# Patient Record
Sex: Male | Born: 1970 | Race: White | Marital: Married | Smoking: Never smoker
Health system: Southern US, Community
[De-identification: ages and names within clinical notes are randomized; demographics above are authoritative.]

## PROBLEM LIST (undated history)

## (undated) DIAGNOSIS — M436 Torticollis: Secondary | ICD-10-CM

## (undated) DIAGNOSIS — M5030 Other cervical disc degeneration, unspecified cervical region: Secondary | ICD-10-CM

## (undated) HISTORY — DX: Torticollis: M43.6

## (undated) HISTORY — DX: Other cervical disc degeneration, unspecified cervical region: M50.30

---

## 2011-10-10 ENCOUNTER — Encounter: Payer: Self-pay | Admitting: Emergency Medicine

## 2011-10-10 ENCOUNTER — Emergency Department (HOSPITAL_COMMUNITY)
Admission: EM | Admit: 2011-10-10 | Discharge: 2011-10-11 | Disposition: A | Discharge status: Home / Self Care | Payer: BC Managed Care – PPO | Attending: Emergency Medicine | Admitting: Emergency Medicine

## 2011-10-10 DIAGNOSIS — R630 Anorexia: Secondary | ICD-10-CM | POA: Insufficient documentation

## 2011-10-10 DIAGNOSIS — R1013 Epigastric pain: Secondary | ICD-10-CM | POA: Insufficient documentation

## 2011-10-10 DIAGNOSIS — R110 Nausea: Secondary | ICD-10-CM | POA: Insufficient documentation

## 2011-10-10 LAB — URINALYSIS, ROUTINE W REFLEX MICROSCOPIC
Hgb urine dipstick: NEGATIVE
Leukocytes, UA: NEGATIVE
Nitrite: NEGATIVE
Specific Gravity, Urine: 1.007 (ref 1.005–1.030)
Urobilinogen, UA: 0.2 mg/dL (ref 0.0–1.0)

## 2011-10-10 LAB — COMPREHENSIVE METABOLIC PANEL
AST: 21 U/L (ref 0–37)
Albumin: 4.1 g/dL (ref 3.5–5.2)
GFR calc Af Amer: >90 mL/min (ref >90)
GFR calc non Af Amer: >90 mL/min (ref >90)
Potassium: 3.9 mEq/L (ref 3.5–5.1)
Sodium: 139 mEq/L (ref 135–145)
Total Bilirubin: 0.8 mg/dL (ref 0.3–1.2)
Total Protein: 7.4 g/dL (ref 6.0–8.3)

## 2011-10-10 LAB — DIFFERENTIAL
Basophils Absolute: 0 10*3/uL (ref 0.0–0.1)
Lymphocytes Relative: 28 % (ref 12–46)
Monocytes Absolute: 1 10*3/uL (ref 0.1–1.0)
Neutro Abs: 5.7 10*3/uL (ref 1.7–7.7)

## 2011-10-10 LAB — CBC
HCT: 43.8 % (ref 39.0–52.0)
Hemoglobin: 14.9 g/dL (ref 13.0–17.0)
RDW: 12.2 % (ref 11.5–15.5)
WBC: 9.7 10*3/uL (ref 4.0–10.5)

## 2011-10-10 NOTE — ED Provider Notes (Signed)
Complains of epigastric pain for 4 days admits to diminished appetite pain is not improved or made worse by anything, waxes and wanes nonradiating no fever admits to diminished appetite. Treated with Cipro without relief On exam no distress abdomen tender epigastrium no guarding rigidity or rebound nondistended normal bowel sounds. No signs of peritonitis.   Doug Sou, MD 10/10/11 [phone]

## 2011-10-10 NOTE — ED Notes (Signed)
Received pt. From triage, NAD noted,pt. Alert and oriented

## 2011-10-10 NOTE — ED Notes (Signed)
PT. REPORTS MID ABDOMINAL PAIN FOR 2 DAYS , SEEN AT AN URGENT CARE - PRESCRIBED WITH CIPRO ANTIBIOTIC , SLIGHT NAUSEA , DENIES VOMITTING OR DIARRHEA ,  NO FEVER OR  CHILLS .

## 2011-10-11 ENCOUNTER — Telehealth: Payer: Self-pay | Admitting: Internal Medicine

## 2011-10-11 ENCOUNTER — Emergency Department (HOSPITAL_COMMUNITY): Payer: BC Managed Care – PPO

## 2011-10-11 MED ORDER — HYDROCODONE-ACETAMINOPHEN 5-325 MG PO TABS: 1.0000 | ORAL_TABLET | ORAL | Status: AC | PRN

## 2011-10-11 MED ORDER — HYDROCODONE-ACETAMINOPHEN 5-500 MG PO TABS: 1.0000 | ORAL_TABLET | Freq: Four times a day (QID) | ORAL | Status: DC | PRN

## 2011-10-11 NOTE — ED Notes (Signed)
Complaining of abd pain for 2 1/2 days. States initially sharp pains through out abd. Now intermittent pain. Transferred to CDU waiting for ultrasound

## 2011-10-11 NOTE — Telephone Encounter (Signed)
I have scheduled the patient an appt for 10/17/11 10:45 with Dr Arlyce Dice.  I have mailed him new patient information.  I have left him a message with all the information.  I have asked him to call back with any questions.

## 2011-10-17 ENCOUNTER — Ambulatory Visit (INDEPENDENT_AMBULATORY_CARE_PROVIDER_SITE_OTHER): Payer: BC Managed Care – PPO | Admitting: Gastroenterology

## 2011-10-17 ENCOUNTER — Encounter: Payer: Self-pay | Admitting: Gastroenterology

## 2011-10-17 VITALS — BP 108/70 | HR 72 | Wt 184.0 lb

## 2011-10-17 DIAGNOSIS — R1013 Epigastric pain: Secondary | ICD-10-CM

## 2011-10-17 MED ORDER — RABEPRAZOLE SODIUM 20 MG PO TBEC: 20.0000 mg | DELAYED_RELEASE_TABLET | Freq: Every day | ORAL | Status: DC

## 2011-10-17 NOTE — Assessment & Plan Note (Addendum)
Pain is suggestive of gallbladder pain despite the absence of stones on ultrasound. Ulcer and  nonulcer dyspepsia are other considerations.  Recommendations #1 begin AcipHex 20 mg daily #2 upper endoscopy #3 to consider HIDA scan if endoscopy is not diagnostic

## 2011-10-17 NOTE — Progress Notes (Signed)
History of Present Illness: [name] is a pleasant 41 year old white male referred by the ER for evaluation of abdominal pain. Approximately a week ago he developed moderately severe, sharp upper epigastric pain with radiation through and around to the back. Pain was worsened postprandially. He eventually was seen in the ER where laboratory including LFTs, CBC and ultrasound were normal. Pain has slowly subsided although it remains . He denies nausea, vomiting or fever. He has no prior GI problems. He is on no gastric irritants including nonsteroidals.    Past Medical History  Diagnosis Date  . Hyperlipidemia   . Hypertension    History reviewed. No pertinent past surgical history. family history includes Ovarian cancer in his mother.  There is no history of Colon cancer. Current Outpatient Prescriptions  Medication Sig Dispense Refill  . HYDROcodone-acetaminophen (NORCO) 5-325 MG per tablet Take 1 tablet by mouth every 4 (four) hours as needed for pain. 1 to 2 tabs PO Q4-6H PRN pain   15 tablet  0   Allergies as of 10/17/2011  . (No Known Allergies)    reports that he has never smoked. He does not have any smokeless tobacco history on file. He reports that he drinks alcohol. He reports that he does not use illicit drugs.     Review of Systems: Pertinent positive and negative review of systems were noted in the above HPI section. All other review of systems were otherwise negative.  Vital signs were reviewed in today's medical record Physical Exam: General: Well developed , well nourished, no acute distress Head: Normocephalic and atraumatic Eyes:  sclerae anicteric, EOMI Ears: Normal auditory acuity Mouth: No deformity or lesions Neck: Supple, no masses or thyromegaly Lungs: Clear throughout to auscultation Heart: Regular rate and rhythm; no murmurs, rubs or bruits Abdomen: Soft, non tender and non distended. No masses, hepatosplenomegaly or hernias noted. Normal Bowel  sounds Rectal:deferred Musculoskeletal: Symmetrical with no gross deformities  Skin: No lesions on visible extremities Pulses:  Normal pulses noted Extremities: No clubbing, cyanosis, edema or deformities noted Neurological: Alert oriented x 4, grossly nonfocal Cervical Nodes:  No significant cervical adenopathy Inguinal Nodes: No significant inguinal adenopathy Psychological:  Alert and cooperative. Normal mood and affect

## 2011-10-17 NOTE — Patient Instructions (Signed)
Your procedure has been scheduled for 10/23/2011, please follow the seperate instructions.  Take Aciphex once a day.

## 2011-10-23 ENCOUNTER — Encounter: Payer: Self-pay | Admitting: Gastroenterology

## 2011-10-23 ENCOUNTER — Ambulatory Visit (AMBULATORY_SURGERY_CENTER): Payer: BC Managed Care – PPO | Admitting: Gastroenterology

## 2011-10-23 VITALS — BP 115/77 | HR 75 | Temp 97.6°F | Resp 22 | Ht 70.0 in | Wt 184.0 lb

## 2011-10-23 DIAGNOSIS — R1013 Epigastric pain: Secondary | ICD-10-CM

## 2011-10-23 MED ORDER — RABEPRAZOLE SODIUM 20 MG PO TBEC: 20.0000 mg | DELAYED_RELEASE_TABLET | Freq: Every day | ORAL | Status: DC

## 2011-10-23 MED ORDER — SODIUM CHLORIDE 0.9 % IV SOLN: 500.0000 mL | INTRAVENOUS | Status: DC

## 2011-10-23 NOTE — Op Note (Signed)
[facility] [address] [place], [place]  [address]  ENDOSCOPY PROCEDURE REPORT  PATIENT:  Keith, Faulkner  MR#:  [id number] BIRTHDATE:  [birth year], 40 yrs. old  GENDER:  male  ENDOSCOPIST:  Barbette Hair. Arlyce Dice, MD Referred by:  PROCEDURE DATE:  10/23/2011 PROCEDURE:  EGD, diagnostic 43235 ASA CLASS:  Class I INDICATIONS:  abdominal pain  MEDICATIONS:   MAC sedation, administered by CRNA, glycopyrrolate (Robinal) 0.2 mg IV, 0.6cc simethancone 0.6 cc PO TOPICAL ANESTHETIC:  DESCRIPTION OF PROCEDURE:   After the risks and benefits of the procedure were explained, informed consent was obtained.  The [facility] [id number] endoscope was introduced through the mouth and advanced to the third portion of the duodenum.  The instrument was slowly withdrawn as the mucosa was fully examined. <<PROCEDUREIMAGES>>  The upper, middle, and distal third of the esophagus were carefully inspected and no abnormalities were noted. The z-line was well seen at the GEJ. The endoscope was pushed into the fundus which was normal including a retroflexed view. The antrum,gastric body, first and second part of the duodenum were unremarkable (see image1, image2, image3, image5, and image7).    Retroflexed views revealed no abnormalities.    The scope was then withdrawn from the patient and the procedure completed.  COMPLICATIONS:  None  ENDOSCOPIC IMPRESSION: 1) Normal EGD RECOMMENDATIONS: 1) continue PPI 2) call office for any more episodes of pain  ______________________________ Barbette Hair. Arlyce Dice, MD  CC:  n. eSIGNED:   Barbette Hair. Cherell Colvin at 10/23/2011 09:55 AM  Keith Faulkner, [phone]

## 2011-10-23 NOTE — Progress Notes (Signed)
The pt tolerated the egd very well. Maw  Propofol was administered by Iline Oven, CRNA. maw  Dr. Arlyce Dice had printed pt's Aciphex RX- didn't have a pharmacy on file.  Pt states he would prefer to have RX sent electronically to [facility] on [address].  Printed RX shredded.

## 2011-10-23 NOTE — Progress Notes (Signed)
Patient did not experience any of the following events: a burn prior to discharge; a fall within the facility; wrong site/side/patient/procedure/implant event; or a hospital transfer or hospital admission upon discharge from the facility. (G8907) Patient did not have preoperative order for IV antibiotic SSI prophylaxis. (G8918)  

## 2011-10-23 NOTE — Patient Instructions (Signed)
FOLLOW DISCHARGE INSTRUCTIONS (BLUE AND GREEN SHEETS).. CONTINUE PPIs. CALL OFFICE FOR MORE EPISODES OF PAIN.

## 2011-10-24 ENCOUNTER — Telehealth: Payer: Self-pay | Admitting: *Deleted

## 2011-10-24 NOTE — Telephone Encounter (Signed)

## 2011-10-30 ENCOUNTER — Telehealth: Payer: Self-pay | Admitting: Gastroenterology

## 2011-10-30 MED ORDER — RABEPRAZOLE SODIUM 20 MG PO TBEC: 20.0000 mg | DELAYED_RELEASE_TABLET | Freq: Every day | ORAL | Status: DC

## 2011-10-30 NOTE — Telephone Encounter (Signed)
Sent in rx to pharmacy.

## 2011-12-17 ENCOUNTER — Telehealth: Payer: Self-pay | Admitting: *Deleted

## 2011-12-17 NOTE — Telephone Encounter (Signed)
Pt returned call, stated he wanted to stay on Aciphex and for me to disregard paper from the insurance company wanting him to switch. Mailed him a discount savings card today for his Aciphex rx

## 2011-12-17 NOTE — Telephone Encounter (Signed)
Left message for pt that his [facility] wants [place] to change his PPI from Aciphex to Omeprazole...Marland KitchenL/M for pt to return call

## 2011-12-18 NOTE — ED Provider Notes (Signed)
History     CSN: [id number]  Arrival date & time 10/10/11  1933   First MD Initiated Contact with Patient 10/10/11 2253      Chief Complaint  Patient presents with  . Abdominal Pain   HPI: Patient is a 41 y.o. male presenting with abdominal pain. The history is provided by the patient.  Abdominal Pain The primary symptoms of the illness include abdominal pain and nausea. The primary symptoms of the illness do not include fever, vomiting, diarrhea, hematemesis or hematochezia. The current episode started more than 2 days ago. The onset of the illness was gradual. The problem has been gradually worsening.  The patient has not had a change in bowel habit. Additional symptoms associated with the illness include anorexia. Symptoms associated with the illness do not include chills or heartburn.  Pt reports 4 day hx of upper abd pain that has persisted. Pain intensity varies and is associated w/ nothing. Admits to mild intermittent nausea w/o vomiting and some anorexia but denies CP, SOB, fever or other sx's. [facility] and placed on Cipro which he states is not helping.  Past Medical History  Diagnosis Date  . Hyperlipidemia   . Hypertension     History reviewed. No pertinent past surgical history.  Family History  Problem Relation Age of Onset  . Ovarian cancer Mother   . Colon cancer Neg Hx     History  Substance Use Topics  . Smoking status: Never Smoker   . Smokeless tobacco: Never Used  . Alcohol Use: 1.8 oz/week    3 Glasses of wine per week     OCCASIONAL      Review of Systems  Constitutional: Negative.  Negative for fever and chills.  HENT: Negative.   Eyes: Negative.   Respiratory: Negative.   Cardiovascular: Negative.   Gastrointestinal: Positive for nausea, abdominal pain and anorexia. Negative for heartburn, vomiting, diarrhea, hematochezia and hematemesis.  Genitourinary: Negative.   Musculoskeletal: Negative.   Skin:  Negative.   Neurological: Negative.   Hematological: Negative.   Psychiatric/Behavioral: Negative.     Allergies  Review of patient's allergies indicates no known allergies.  Home Medications   Current Outpatient Rx  Name Route Sig Dispense Refill  . HYDROCODONE-ACETAMINOPHEN 5-325 MG PO TABS Oral Take 5-325 mg by mouth as needed.    Marland Kitchen RABEPRAZOLE SODIUM 20 MG PO TBEC Oral Take 1 tablet (20 mg total) by mouth daily. 15 tablet 0  . RABEPRAZOLE SODIUM 20 MG PO TBEC Oral Take 1 tablet (20 mg total) by mouth daily. 30 tablet 1    BP 122/84  Pulse 80  Temp(Src) 98.5 F (36.9 C) (Oral)  Resp 20  SpO2 96%  Physical Exam  Constitutional: He is oriented to person, place, and time. He appears well-developed and well-nourished.  HENT:  Head: Normocephalic and atraumatic.  Eyes: Conjunctivae are normal.  Neck: Neck supple.  Cardiovascular: Normal rate and regular rhythm.   Pulmonary/Chest: Effort normal and breath sounds normal.  Abdominal: Soft. Bowel sounds are normal.    Musculoskeletal: Normal range of motion.  Neurological: He is alert and oriented to person, place, and time.  Skin: Skin is warm and dry.  Psychiatric: He has a normal mood and affect.    ED Course  Procedures  Discussed pt w/ Dr Ethelda Chick who has also seen and examined pt. Will consult w/ GI regarding plan and attempt to facilitate f/u.  I have spoken w/ Dr  Christella Hartigan who has recommended abd u/s tonight and f/u in office if no acute findings. Pt is agreeable w/ this plan.  Will obtain abd u/s and re-eval. Pt declines medication for pain at this time..  Findings and clinical impression discussed w/ pt. Will plan for d/c home w/ medication for pain encourage f/u in office w/ Dr Christella Hartigan. Pt agreeable w/ plan.   Labs Reviewed  CBC  DIFFERENTIAL  LIPASE, BLOOD  URINALYSIS, ROUTINE W REFLEX MICROSCOPIC  COMPREHENSIVE METABOLIC PANEL  LAB REPORT - SCANNED   No results found.   1. Abdominal pain        MDM  HPI/PE and clinical findings c/w 1. Upper abd pain (Epigastric TTP, mild nausea w/o vomiting, no CP or SOB, labs, urine and abd u/s w/o acute findings)        Leanne Chang, NP 12/18/11 1239

## 2011-12-19 NOTE — ED Provider Notes (Signed)
Medical screening examination/treatment/procedure(s) were conducted as a shared visit with non-physician practitioner(s) and myself.  I personally evaluated the patient during the encounter  Doug Sou, MD 12/19/11 [phone]

## 2012-01-08 ENCOUNTER — Other Ambulatory Visit: Payer: Self-pay | Admitting: Gastroenterology

## 2013-01-27 IMAGING — US US ABDOMEN COMPLETE
1 series · 14 of 25 positions shown · non-contrast
Comparison: None.

CLINICAL DATA: Upper abdominal pain

COMPLETE ABDOMINAL ULTRASOUND

[Series 1: us abdomen complete · 0.30mm/px · 14 of 41 slices shown]
[im 1/41]
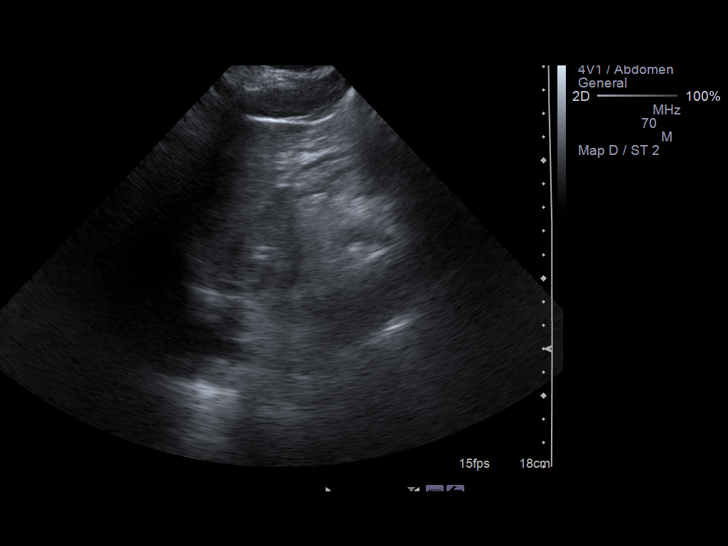
[im 4/41]
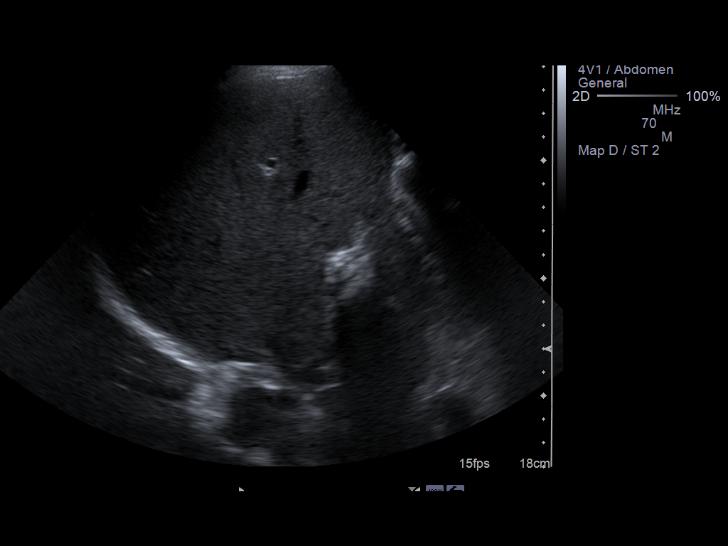
[im 7/41]
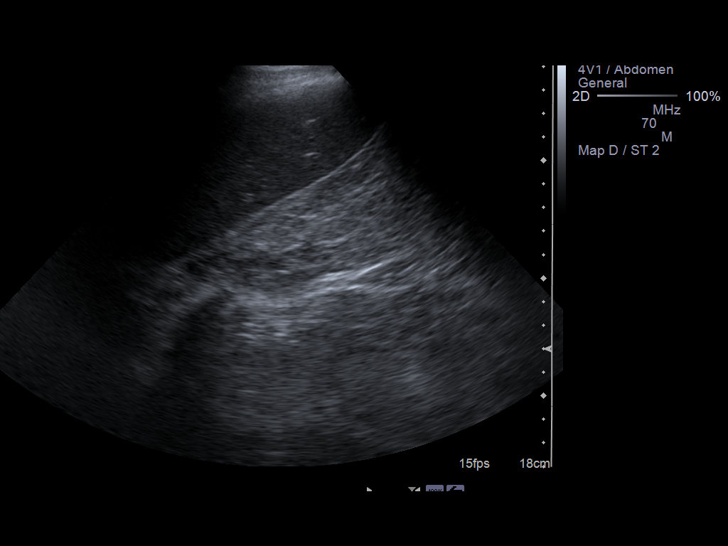
[im 11/41]
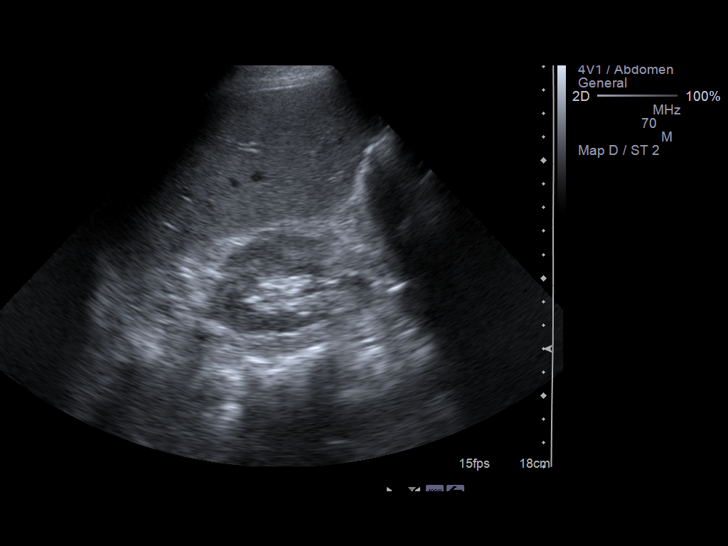
[im 14/41]
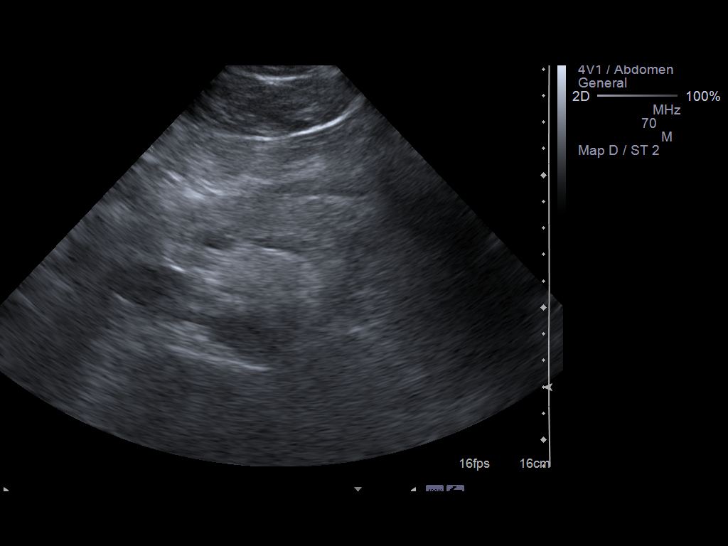
[im 16/41]
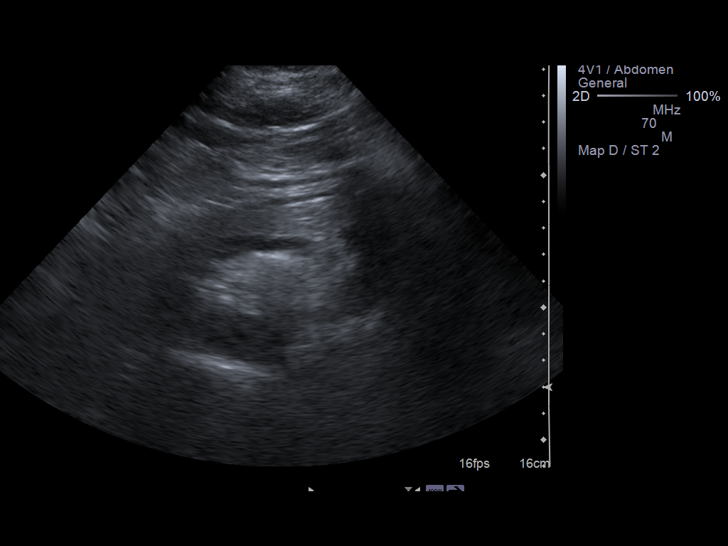
[im 19/41]
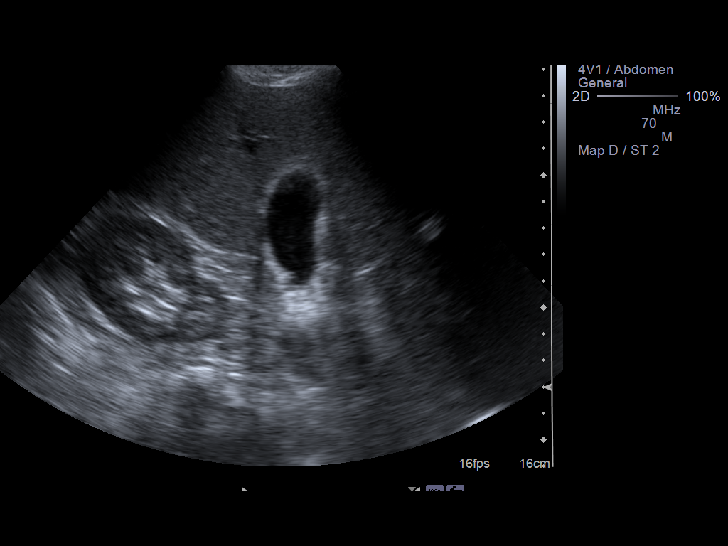
[im 22/41]
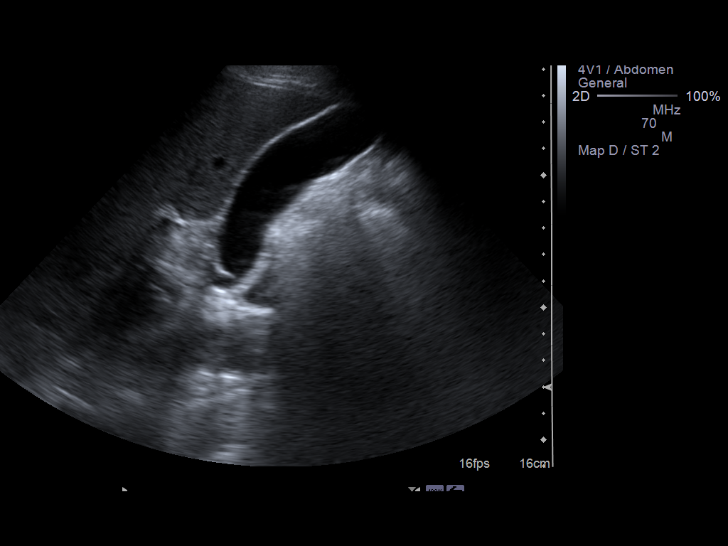
[im 26/41]
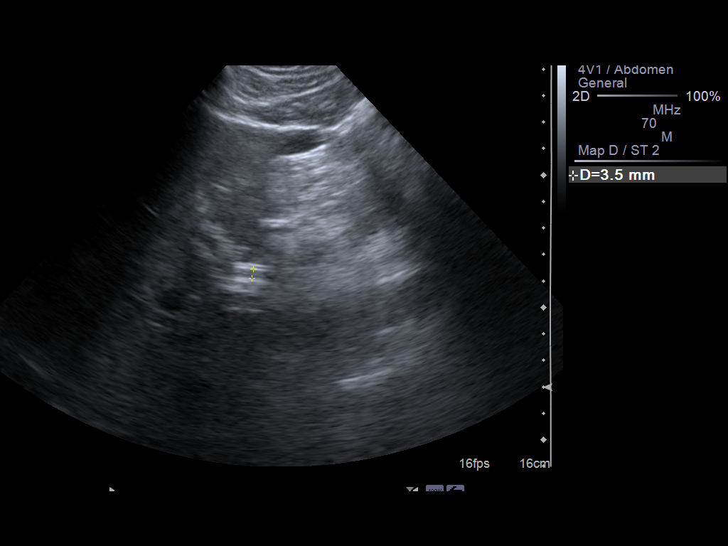
[im 27/41]
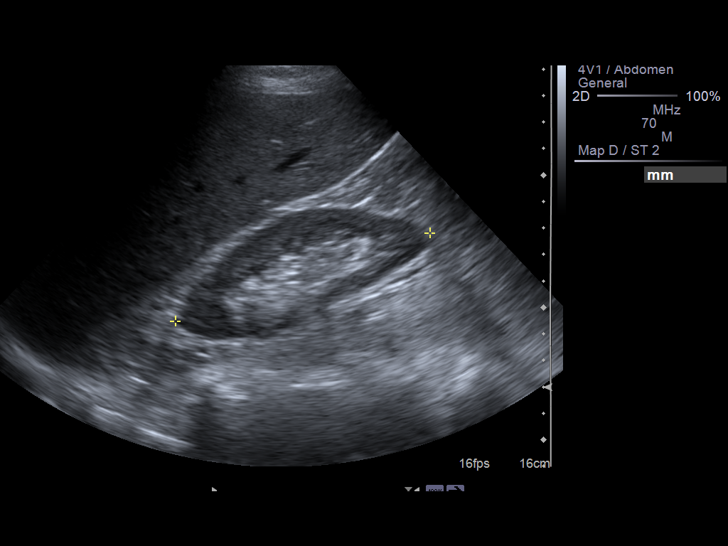
[im 31/41]
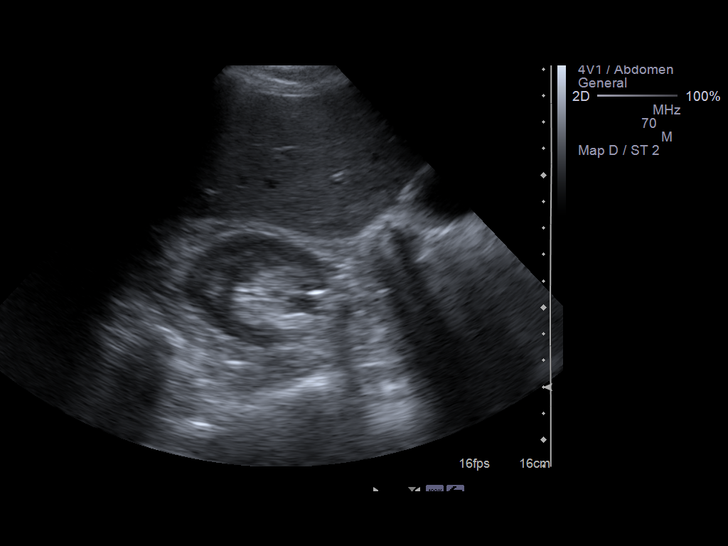
[im 34/41]
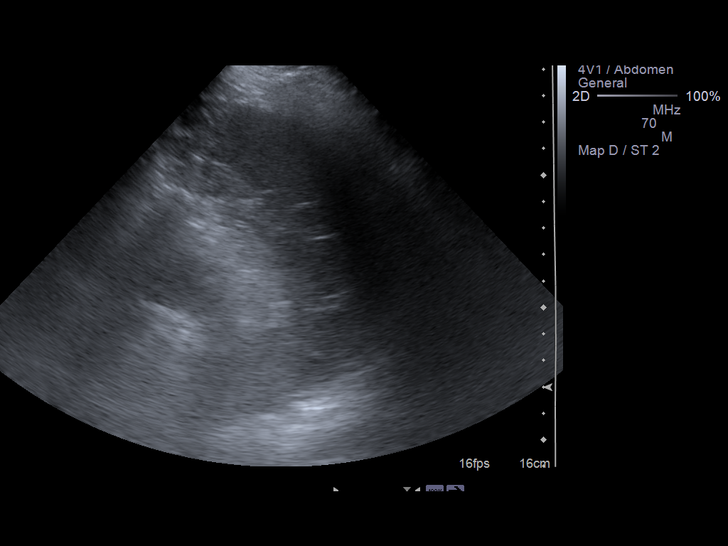
[im 37/41]
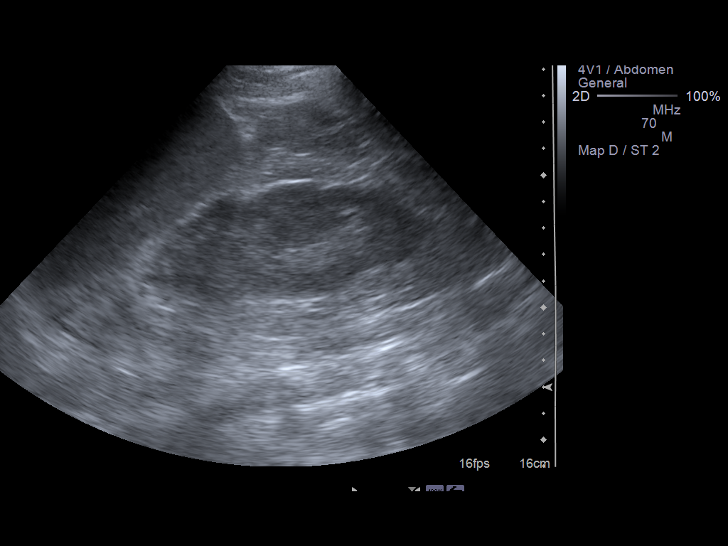
[im 41/41]
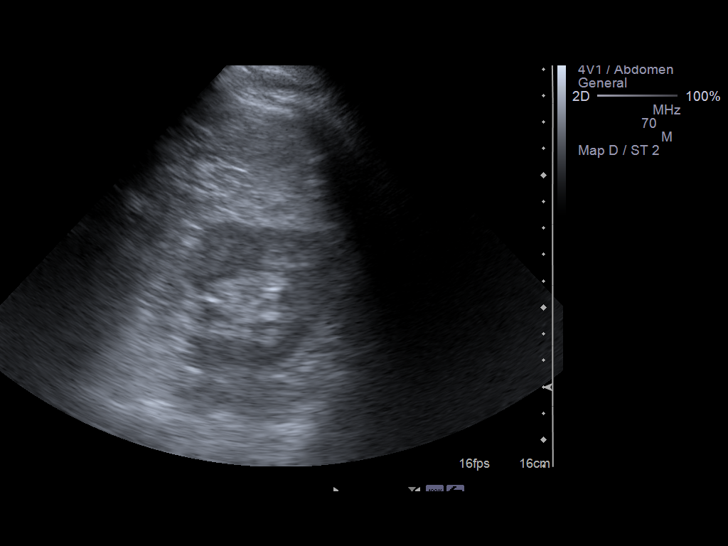

[14 of 25 positions shown; findings below may reference images not displayed]

FINDINGS: Gallbladder:  No gallstones, gallbladder wall thickening, or
pericholecystic fluid.

Common bile duct:  Normal caliber.  3 mm diameter measured.

Liver:  No focal lesion identified.  Within normal limits in
parenchymal echogenicity.

IVC:  Appears normal.

Pancreas:  Limited visualization due to overlying bowel gas.
Segments of the pancreatic body are unremarkable.

Spleen:  Spleen length measures 10.4 cm.  Normal homogeneous
parenchymal echotexture.

Right Kidney:  Right kidney measures 10.2 cm length.  No
hydronephrosis.

Left Kidney:  Left kidney measures 11.5 cm length.  No
hydronephrosis.

Abdominal aorta:  The abdominal aorta is mostly obscured by
overlying bowel gas.
IMPRESSION: Negative abdominal ultrasound.

## 2019-01-20 ENCOUNTER — Encounter: Payer: Self-pay | Admitting: *Deleted

## 2019-01-21 ENCOUNTER — Other Ambulatory Visit: Payer: Self-pay

## 2019-01-21 ENCOUNTER — Encounter: Payer: Self-pay | Admitting: Neurology

## 2019-01-21 ENCOUNTER — Ambulatory Visit (INDEPENDENT_AMBULATORY_CARE_PROVIDER_SITE_OTHER): Payer: BLUE CROSS/BLUE SHIELD | Admitting: Neurology

## 2019-01-21 ENCOUNTER — Telehealth: Payer: Self-pay | Admitting: Neurology

## 2019-01-21 DIAGNOSIS — G243 Spasmodic torticollis: Secondary | ICD-10-CM

## 2019-01-21 NOTE — Progress Notes (Signed)
PATIENT: Keith NoelDavid Kreager DOB: 10/16/1970  Virtual Visit via Video  I connected with Keith Faulkner Milliner on 01/21/19 at  by video and verified that I am speaking with the correct person using two identifiers.   I discussed the limitations, risks, security and privacy concerns of performing an evaluation and management service by video and the availability of in person appointments. I also discussed with the patient that there may be a patient responsible charge related to this service. The patient expressed understanding and agreed to proceed.  HISTORICAL  Keith NoelDavid Rollins is a 48 year old male, seen in request by Dr. Barnett AbuElsner, [place] for evaluation of abnormal neck posturing  He was previously healthy, without clear triggers, he began to notice abnormal neck posturing since summer 2019, he has difficulty finding a comfortable position, later noticed constant neck pulling towards the left shoulder, leaning backwards, slow progressive, denies significant radiating pain to bilateral shoulder, or upper extremity, he denies gait abnormality  He was seen by chiropractor, with only minimal improvement, was later referred to have MRI of cervical spine [facility] in January 2020, C1-2, mild counterclockwise rotation of C1 relative to mild clockwise rotation of C2, with corresponding mild posterior positioning of the right lateral mass of C1 relative to right lateral mass of C2, mild multilevel degenerative changes, there was no significant canal or foraminal narrowing, no evidence of cord compression.  He also went to a round of physical therapy, with mild improvement,   Observations/Objective: I have reviewed problem lists, medications, allergies.  Tendency to lean his neck to his left shoulder, with mild retrocollis  Assessment and Plan: Cervical dystonia  Proceed with preauthorization for Xeomin 300 units, will use 200 units during initial injection  Follow Up Instructions:   4 weeks for injection    I discussed the assessment and treatment plan with the patient. The patient was provided an opportunity to ask questions and all were answered. The patient agreed with the plan and demonstrated an understanding of the instructions.   The patient was advised to call back or seek an in-person evaluation if the symptoms worsen or if the condition fails to improve as anticipated.  I provided 30 minutes of non-face-to-face time during this encounter.  REVIEW OF SYSTEMS: Full 14 system review of systems performed and notable only for  As above All other review of systems were negative.  ALLERGIES: No Known Allergies  HOME MEDICATIONS: No current outpatient medications on file.   No current facility-administered medications for this visit.     PAST MEDICAL HISTORY: Past Medical History:  Diagnosis Date  . Degenerative disc disease, cervical   . Torticollis     PAST SURGICAL HISTORY: No past surgical history on file.  FAMILY HISTORY: Family History  Problem Relation Age of Onset  . Ovarian cancer Mother   . Other Father        degenerative disc disease  . Colon cancer Neg Hx     SOCIAL HISTORY:   Social History   Socioeconomic History  . Marital status: Married    Spouse name: Not on file  . Number of children: 2  . Years of education: college  . Highest education level: Not on file  Occupational History  . Not on file  Social Needs  . Financial resource strain: Not on file  . Food insecurity:    Worry: Not on file    Inability: Not on file  . Transportation needs:    Medical: Not on file    Non-medical:  Not on file  Tobacco Use  . Smoking status: Never Smoker  . Smokeless tobacco: Never Used  Substance and Sexual Activity  . Alcohol use: Yes    Alcohol/week: 3.0 standard drinks    Types: 3 Glasses of wine per week    Comment: OCCASIONAL  . Drug use: No  . Sexual activity: Not on file  Lifestyle  . Physical activity:    Days per week: Not on file     Minutes per session: Not on file  . Stress: Not on file  Relationships  . Social connections:    Talks on phone: Not on file    Gets together: Not on file    Attends religious service: Not on file    Active member of club or organization: Not on file    Attends meetings of clubs or organizations: Not on file    Relationship status: Not on file  . Intimate partner violence:    Fear of current or ex partner: Not on file    Emotionally abused: Not on file    Physically abused: Not on file    Forced sexual activity: Not on file  Other Topics Concern  . Not on file  Social History Narrative   Lives at home with his family.   Right-handed.   3-4 cups caffeine per day.    Levert Feinstein, M.D. Ph.D.  [facility] [address], [address] [place], [place] [address] Ph: [phone] Fax: [phone]  CC: Barnett Abu, MD

## 2019-01-21 NOTE — Telephone Encounter (Signed)
Please initiate paper work for xeomin 300 units for cervical dystonia. But only use 200 units for the initial injection

## 2019-01-22 NOTE — Telephone Encounter (Signed)
I called and scheduled the patient. DW  °

## 2019-01-26 ENCOUNTER — Telehealth: Payer: Self-pay | Admitting: Neurology

## 2019-01-26 NOTE — Telephone Encounter (Signed)
BOTOX appt for May

## 2019-01-28 ENCOUNTER — Ambulatory Visit: Payer: BLUE CROSS/BLUE SHIELD | Admitting: Neurology

## 2019-01-28 NOTE — Telephone Encounter (Signed)
I am not sure, according to Dr. Zannie Cove note for check-out she states Botox appointment for May.

## 2019-01-28 NOTE — Telephone Encounter (Signed)
Noted, patient has already been scheduled. DW

## 2019-01-29 ENCOUNTER — Telehealth: Payer: Self-pay | Admitting: Neurology

## 2019-01-29 NOTE — Telephone Encounter (Signed)
Pt has called back and states May 27th @ 3:30 will work for him.

## 2019-01-29 NOTE — Telephone Encounter (Signed)
His appt has been changed.

## 2019-01-29 NOTE — Telephone Encounter (Signed)
I called the patient to move his injection. If he calls back please offer him May 27th at 3:30. Please send me a phone note if this will work.

## 2019-01-29 NOTE — Telephone Encounter (Signed)
Noted, thank you

## 2019-01-29 NOTE — Telephone Encounter (Signed)
Marcelino Duster, would you please work this apt in for me?

## 2019-03-03 ENCOUNTER — Telehealth: Payer: Self-pay | Admitting: Neurology

## 2019-03-03 NOTE — Telephone Encounter (Signed)
I spoke with [place] who stated that services are covered at 100 percent after 40 dollar copay. PA requirements- [phone] [id number] [phone].   I completed PA request for [id number] (03/03/19-06/02/29). DW

## 2019-03-04 ENCOUNTER — Other Ambulatory Visit: Payer: Self-pay

## 2019-03-04 ENCOUNTER — Encounter: Payer: Self-pay | Admitting: Neurology

## 2019-03-04 ENCOUNTER — Ambulatory Visit (INDEPENDENT_AMBULATORY_CARE_PROVIDER_SITE_OTHER): Payer: BLUE CROSS/BLUE SHIELD | Admitting: Neurology

## 2019-03-04 VITALS — BP 132/92 | HR 64 | Temp 96.2°F | Ht 70.0 in | Wt 209.0 lb

## 2019-03-04 DIAGNOSIS — G243 Spasmodic torticollis: Secondary | ICD-10-CM

## 2019-03-04 MED ORDER — INCOBOTULINUMTOXINA 100 UNITS IM SOLR
200.0000 [IU] | INTRAMUSCULAR | Status: DC
  Administered 2019-03-04: 200 [IU] via INTRAMUSCULAR

## 2019-03-04 NOTE — Progress Notes (Signed)
**  Xeomin 100 units x 2 vials, NDC 0259-1610-01, Lot [id number], Exp 01/2021, office supply.//mck,rn** 

## 2019-03-04 NOTE — Progress Notes (Signed)
PATIENT: Keith NoelDavid Faulkner DOB: 10/08/1971  Chief Complaint  Patient presents with  . Cervical Dystonia    Xeomin 100 units x 2 vials - office supply. 1st injection.     HISTORICAL  HISTORICAL  Keith Faulkner is a [age] year old male, seen in request by Dr. Barnett AbuElsner, [place] for evaluation of abnormal neck posturing  He was previously healthy, without clear triggers, he began to notice abnormal neck posturing since summer 2019, he has difficulty finding a comfortable position, later noticed constant neck pulling towards the left shoulder, leaning backwards, slow progressive, denies significant radiating pain to bilateral shoulder, or upper extremity, he denies gait abnormality  He was seen by chiropractor, with only minimal improvement, was later referred to have MRI of cervical spine [facility] in January 2020, C1-2, mild counterclockwise rotation of C1 relative to mild clockwise rotation of C2, with corresponding mild posterior positioning of the right lateral mass of C1 relative to right lateral mass of C2, mild multilevel degenerative changes, there was no significant canal or foraminal narrowing, no evidence of cord compression.  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, potential side effect explained, He was noted to have mild left shoulder elevation, mild retrocollis, moderate right turn, mild left tilt, significant atrophy of right upper cervical paraspinal muscles, tenderness,  REVIEW OF SYSTEMS: Full 14 system review of systems performed and notable only for as above. All other review of systems were negative.  ALLERGIES: No Known Allergies  HOME MEDICATIONS: Current Outpatient Medications  Medication Sig Dispense Refill  . incobotulinumtoxinA (XEOMIN) 100 units SOLR injection Inject 200 Units into the muscle every 3 (three) months.     No current  facility-administered medications for this visit.     PAST MEDICAL HISTORY: Past Medical History:  Diagnosis Date  . Degenerative disc disease, cervical   . Torticollis     PAST SURGICAL HISTORY: History reviewed. No pertinent surgical history.  FAMILY HISTORY: Family History  Problem Relation Age of Onset  . Ovarian cancer Mother   . Other Father        degenerative disc disease  . Colon cancer Neg Hx     SOCIAL HISTORY: Social History   Socioeconomic History  . Marital status: Married    Spouse name: Not on file  . Number of children: 2  . Years of education: college  . Highest education level: Not on file  Occupational History  . Not on file  Social Needs  . Financial resource strain: Not on file  . Food insecurity:    Worry: Not on file    Inability: Not on file  . Transportation needs:    Medical: Not on file    Non-medical: Not on file  Tobacco Use  . Smoking status: Never Smoker  . Smokeless tobacco: Never Used  Substance and Sexual Activity  . Alcohol use: Yes    Alcohol/week: 3.0 standard drinks    Types: 3 Glasses of wine per week    Comment: OCCASIONAL  . Drug use: No  . Sexual activity: Not on file  Lifestyle  . Physical activity:    Days per week: Not on file    Minutes per session: Not on file  . Stress: Not on file  Relationships  . Social connections:    Talks on phone: Not on file    Gets together: Not on file  Attends religious service: Not on file    Active member of club or organization: Not on file    Attends meetings of clubs or organizations: Not on file    Relationship status: Not on file  . Intimate partner violence:    Fear of current or ex partner: Not on file    Emotionally abused: Not on file    Physically abused: Not on file    Forced sexual activity: Not on file  Other Topics Concern  . Not on file  Social History Narrative   Lives at home with his family.   Right-handed.   3-4 cups caffeine per day.      PHYSICAL EXAM   Vitals:   03/04/19 1559  BP: (!) 132/92  Pulse: 64  Temp: (!) 96.2 F (35.7 C)  Weight: 209 lb (94.8 kg)  Height: 5\' 10"  (1.778 m)    Not recorded      Body mass index is 29.99 kg/m.  PHYSICAL EXAMNIATION:  Gen: NAD, conversant, well nourised, obese, well groomed                     Cardiovascular: Regular rate rhythm, no peripheral edema, warm, nontender. Eyes: Conjunctivae clear without exudates or hemorrhage Neck: Supple, no carotid bruits. Pulmonary: Clear to auscultation bilaterally   NEUROLOGICAL EXAM:  MENTAL STATUS: Speech:    Speech is normal; fluent and spontaneous with normal comprehension.  Cognition:     Orientation to time, place and person     Normal recent and remote memory     Normal Attention span and concentration     Normal Language, naming, repeating,spontaneous speech     Fund of knowledge   CRANIAL NERVES: CN II: Visual fields are full to confrontation.   Pupils are round equal and briskly reactive to light. CN III, IV, VI: extraocular movement are normal. No ptosis. CN V: Facial sensation is intact to pinprick in all 3 divisions bilaterally. Corneal responses are intact.  CN VII: Face is symmetric with normal eye closure and smile. CN VIII: Hearing is normal to rubbing fingers CN IX, X: Palate elevates symmetrically. Phonation is normal. CN XI: Head turning and shoulder shrug are intact CN XII: Tongue is midline with normal movements and no atrophy.  MOTOR: HISTORICAL  Keith Faulkner is a 48 year old male, seen in request by Dr. Barnett Abu for evaluation of abnormal neck posturing  He was previously healthy, without clear triggers, he began to notice abnormal neck posturing since summer 2019, he has difficulty finding a comfortable position, later noticed constant neck pulling towards the left shoulder, leaning backwards, slow progressive, denies significant radiating pain to bilateral shoulder, or upper extremity, he  denies gait abnormality  He was seen by chiropractor, with only minimal improvement, was later referred to have MRI of cervical spine [facility] in January 2020, C1-2, mild counterclockwise rotation of C1 relative to mild clockwise rotation of C2, with corresponding mild posterior positioning of the right lateral mass of C1 relative to right lateral mass of C2, mild multilevel degenerative changes, there was no significant canal or foraminal narrowing, no evidence of cord compression.  He also went to a round of physical therapy, with mild improvement,  UPDATE Mar 04 2019:    mild left shoulder elevation, mild retrocollis, moderate right turn, mild left tilt, significant atrophy of right upper cervical paraspinal muscles, tenderness, good range of motion  There is no pronator drift of out-stretched arms. Muscle bulk and tone are normal.  Muscle strength is normal.  REFLEXES: Reflexes are 2+ and symmetric at the biceps, triceps, knees, and ankles. Plantar responses are flexor.  SENSORY: Intact to light touch, pinprick, positional sensation and vibratory sensation are intact in fingers and toes.  COORDINATION: Rapid alternating movements and fine finger movements are intact. There is no dysmetria on finger-to-nose and heel-knee-shin.    GAIT/STANCE: Posture is normal. Gait is steady with normal steps, base, arm swing, and turning. Heel and toe walking are normal. Tandem gait is normal.  Romberg is absent.   DIAGNOSTIC DATA (LABS, IMAGING, TESTING) - I reviewed patient records, labs, notes, testing and imaging myself where available.   ASSESSMENT AND PLAN  Keith Faulkner is a 48 y.o. 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 Ph.D.  [facility] [address], [address] [place], [place] [address] Ph: [phone] Fax: [phone]  CC: Referring Provider

## 2019-03-11 ENCOUNTER — Ambulatory Visit: Payer: BLUE CROSS/BLUE SHIELD | Admitting: Neurology

## 2020-03-16 ENCOUNTER — Telehealth: Payer: Self-pay | Admitting: Neurology

## 2020-03-16 NOTE — Telephone Encounter (Signed)
I returned the call to the patient. He would like to come in and discuss restarting Xeomin. His last injection was 03/03/20 w/ 200 units. He has been placed on the schedule for 03/22/20.

## 2020-03-16 NOTE — Telephone Encounter (Signed)
Pt called stating that his symptoms are coming back and is wanting to discuss the possibility of receiving the injections again. Please advise.

## 2020-03-22 ENCOUNTER — Encounter: Payer: Self-pay | Admitting: Neurology

## 2020-03-22 ENCOUNTER — Ambulatory Visit: Payer: BC Managed Care – PPO | Admitting: Neurology

## 2020-03-22 VITALS — BP 124/87 | HR 89 | Ht 70.0 in | Wt 215.5 lb

## 2020-03-22 DIAGNOSIS — G243 Spasmodic torticollis: Secondary | ICD-10-CM | POA: Diagnosis not present

## 2020-03-22 NOTE — Progress Notes (Signed)
PATIENT: Keith Faulkner DOB: 27-Sep-1971  Chief Complaint  Patient presents with  . Cervical Dystonia    His last Xeomin injection was 03/04/19. He would like to discuss restarting treatment.      HISTORICAL  HISTORICAL  Keith Faulkner is a 49 year old male, seen in request by Dr. Barnett Abu for evaluation of abnormal neck posturing  He was previously healthy, without clear triggers, he began to notice abnormal neck posturing since summer 2019, he has difficulty finding a comfortable position, later noticed constant neck pulling towards the left shoulder, leaning backwards, slow progressive, denies significant radiating pain to bilateral shoulder, or upper extremity, he denies gait abnormality  He was seen by chiropractor, with only minimal improvement, was later referred to have MRI of cervical spine [facility] in January 2020, C1-2, mild counterclockwise rotation of C1 relative to mild clockwise rotation of C2, with corresponding mild posterior positioning of the right lateral mass of C1 relative to right lateral mass of C2, mild multilevel degenerative changes, there was no significant canal or foraminal narrowing, no evidence of cord compression.  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, potential side effect explained, He was noted to have mild left shoulder elevation, mild retrocollis, moderate right turn, mild left tilt, significant atrophy of right upper cervical paraspinal muscles, tenderness,  UPDATE March 22 2020: Reported 80% improvement with his first EMG guided Xeomin injection in May 2020, he lost follow-up due to concern of pandemic, he noticed increased abnormal neck posturing, forceful turning towards the right side, also head titubation, want to resume Xeomin injection, there was no significant side effect from previous injection  REVIEW OF  SYSTEMS: Full 14 system review of systems performed and notable only for as above. All other review of systems were negative.  ALLERGIES: No Known Allergies  HOME MEDICATIONS: No current outpatient medications on file.   No current facility-administered medications for this visit.    PAST MEDICAL HISTORY: Past Medical History:  Diagnosis Date  . Degenerative disc disease, cervical   . Torticollis     PAST SURGICAL HISTORY: History reviewed. No pertinent surgical history.  FAMILY HISTORY: Family History  Problem Relation Age of Onset  . Ovarian cancer Mother   . Other Father        degenerative disc disease  . Colon cancer Neg Hx     SOCIAL HISTORY: Social History   Socioeconomic History  . Marital status: Married    Spouse name: Not on file  . Number of children: 2  . Years of education: college  . Highest education level: Not on file  Occupational History  . Not on file  Tobacco Use  . Smoking status: Never Smoker  . Smokeless tobacco: Never Used  Substance and Sexual Activity  . Alcohol use: Yes    Alcohol/week: 3.0 standard drinks    Types: 3 Glasses of wine per week    Comment: OCCASIONAL  . Drug use: No  . Sexual activity: Not on file  Other Topics Concern  . Not on file  Social History Narrative   Lives at home with his family.   Right-handed.   3-4 cups caffeine per day.   Social Determinants of Health   Financial Resource Strain:   . Difficulty of Paying Living Expenses:   Food Insecurity:   . Worried About Programme researcher, broadcasting/film/video in the Last Year:   .  Ran Out of Food in the Last Year:   Transportation Needs:   . Film/video editor (Medical):   Marland Kitchen Lack of Transportation (Non-Medical):   Physical Activity:   . Days of Exercise per Week:   . Minutes of Exercise per Session:   Stress:   . Feeling of Stress :   Social Connections:   . Frequency of Communication with Friends and Family:   . Frequency of Social Gatherings with Friends and  Family:   . Attends Religious Services:   . Active Member of Clubs or Organizations:   . Attends Archivist Meetings:   Marland Kitchen Marital Status:   Intimate Partner Violence:   . Fear of Current or Ex-Partner:   . Emotionally Abused:   Marland Kitchen Physically Abused:   . Sexually Abused:      PHYSICAL EXAM   Vitals:   03/22/20 1106  BP: 124/87  Pulse: 89  Weight: 215 lb 8 oz (97.8 kg)  Height: 5\' 10"  (1.778 m)   Not recorded     Body mass index is 30.92 kg/m.  PHYSICAL EXAMNIATION:  Gen: NAD, conversant, well nourised, obese, well groomed                     Cardiovascular: Regular rate rhythm, no peripheral edema, warm, nontender. Eyes: Conjunctivae clear without exudates or hemorrhage Neck: Supple, no carotid bruits. Pulmonary: Clear to auscultation bilaterally   NEUROLOGICAL EXAM:  MENTAL STATUS: Speech:    Speech is normal; fluent and spontaneous with normal comprehension.  Cognition:     Orientation to time, place and person     Normal recent and remote memory     Normal Attention span and concentration     Normal Language, naming, repeating,spontaneous speech     Fund of knowledge   CRANIAL NERVES: CN II: Visual fields are full to confrontation.   Pupils are round equal and briskly reactive to light. CN III, IV, VI: extraocular movement are normal. No ptosis. CN V: Facial sensation is intact to pinprick in all 3 divisions bilaterally. Corneal responses are intact.  CN VII: Face is symmetric with normal eye closure and smile. CN VIII: Hearing is normal to rubbing fingers CN IX, X: . Phonation is normal. CN XI: Head turning and shoulder shrug are intact   MOTOR: He has mild left shoulder elevation, mild retrocollis, moderate to severe right turn, mild left tilt, significant  hypertrophy of right upper cervical paraspinal muscles, tenderness on palpitation, good range of motion, occasionally no-no head titubation  There is no pronator drift of out-stretched  arms. Muscle bulk and tone are normal. Muscle strength is normal.  REFLEXES: Reflexes are 2+ and symmetric at the biceps, triceps, knees, and ankles. Plantar responses are flexor.  SENSORY: Intact to light touch, pinprick, positional sensation and vibratory sensation are intact in fingers and toes.  COORDINATION: Rapid alternating movements and fine finger movements are intact. There is no dysmetria on finger-to-nose and heel-knee-shin.    GAIT/STANCE: Tendency for head turning towards the right side, mild decreased swing on the right arm, mild left shoulder elevation   DIAGNOSTIC DATA (LABS, IMAGING, TESTING) - I reviewed patient records, labs, notes, testing and imaging myself where available.   ASSESSMENT AND PLAN  Keith Faulkner is a 49 y.o. male   Cervical dystonia: He has mild left shoulder elevation, mild retrocollis, moderate to severe right turn, mild left tilt, significant  hypertrophy of right upper cervical paraspinal muscles, tenderness on palpitation, good range  of motion  Preauthorization for Xeomin, 300 units, will use 200 unit at next injection 1 month    Levert Feinstein, M.D. Ph.D.  [facility] [address], [address] [place], [place] [address] Ph: [phone] Fax: [phone]  CC: Referring Provider

## 2020-03-23 ENCOUNTER — Telehealth: Payer: Self-pay | Admitting: Neurology

## 2020-03-23 NOTE — Telephone Encounter (Signed)
I called patient's insurance [employer] [phone]) and I spoke with Simon Rhein who stated that PA is through [employer] [phone]). I called and spoke with Drue Flirt, who states that [phone] is approved for the 300U for G24.3. PA #[id number]. Valid from 03/23/20 to 06/23/20. B/B.

## 2020-03-23 NOTE — Telephone Encounter (Signed)
I called the patient and scheduled his Xeomin injection for 04/21/20.

## 2020-04-21 ENCOUNTER — Ambulatory Visit: Payer: BC Managed Care – PPO | Admitting: Neurology

## 2020-04-21 ENCOUNTER — Other Ambulatory Visit: Payer: Self-pay

## 2020-04-21 ENCOUNTER — Encounter: Payer: Self-pay | Admitting: Neurology

## 2020-04-21 VITALS — BP 124/79 | HR 74 | Ht 70.0 in | Wt 213.5 lb

## 2020-04-21 DIAGNOSIS — G243 Spasmodic torticollis: Secondary | ICD-10-CM | POA: Diagnosis not present

## 2020-04-21 MED ORDER — PROPRANOLOL HCL 40 MG PO TABS: 40.0000 mg | ORAL_TABLET | Freq: Two times a day (BID) | ORAL | 3 refills | Status: DC

## 2020-04-21 NOTE — Progress Notes (Addendum)
PATIENT: Keith Faulkner DOB: 12-25-1970  Chief Complaint  Patient presents with  . Cervical Dystonia    Xeomin     HISTORICAL  HISTORICAL  Keith Faulkner is a 49 year old male, seen in request by Dr. Barnett Abu for evaluation of abnormal neck posturing  He was previously healthy, without clear triggers, he began to notice abnormal neck posturing since summer 2019, he has difficulty finding a comfortable position, later noticed constant neck pulling towards the left shoulder, leaning backwards, slow progressive, denies significant radiating pain to bilateral shoulder, or upper extremity, he denies gait abnormality  He was seen by chiropractor, with only minimal improvement, was later referred to have MRI of cervical spine [facility] in January 2020, C1-2, mild counterclockwise rotation of C1 relative to mild clockwise rotation of C2, with corresponding mild posterior positioning of the right lateral mass of C1 relative to right lateral mass of C2, mild multilevel degenerative changes, there was no significant canal or foraminal narrowing, no evidence of cord compression.  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, potential side effect explained, He was noted to have mild left shoulder elevation, mild retrocollis, moderate right turn, mild left tilt, significant atrophy of right upper cervical paraspinal muscles, tenderness,  UPDATE March 22 2020: Reported 80% improvement with his first EMG guided Xeomin injection in May 2020, he lost follow-up due to concern of pandemic, he noticed increased abnormal neck posturing, forceful turning towards the right side, also head titubation, want to resume Xeomin injection, there was no significant side effect from previous injectionI called the patient and scheduled his Xeomin injection for 04/21/20.  UPDATE April 24 2020: He did  not continue 3 months scheduled Xeomin injection for his cervical dystonia in 2020, despite significant improvement in May 2020, he has a lot of cervical dystonia related questions, we have discussed the treatment option, decided to continue injection now, we are also seeking second opinion, I have referred him to [facility] neuromuscular clinic The most bothersome symptoms from his cervical dystonia are frequent head titubation, which often called attention by his colleagues, he also complains of posterior neck muscle strain, stiffness,   REVIEW OF SYSTEMS: Full 14 system review of systems performed and notable only for as above. All other review of systems were negative.  ALLERGIES: No Known Allergies  HOME MEDICATIONS: Current Outpatient Medications  Medication Sig Dispense Refill  . incobotulinumtoxinA (XEOMIN) 100 units SOLR injection Inject 200 Units into the muscle every 3 (three) months.     No current facility-administered medications for this visit.    PAST MEDICAL HISTORY: Past Medical History:  Diagnosis Date  . Degenerative disc disease, cervical   . Torticollis     PAST SURGICAL HISTORY: History reviewed. No pertinent surgical history.  FAMILY HISTORY: Family History  Problem Relation Age of Onset  . Ovarian cancer Mother   . Other Father        degenerative disc disease  . Colon cancer Neg Hx     SOCIAL HISTORY: Social History   Socioeconomic History  . Marital status: Married    Spouse name: Not on file  . Number of children: 2  . Years of education: college  . Highest education level: Not on file  Occupational History  . Not on file  Tobacco Use  . Smoking status: Never Smoker  . Smokeless tobacco: Never Used  Substance and  Sexual Activity  . Alcohol use: Yes    Alcohol/week: 3.0 standard drinks    Types: 3 Glasses of wine per week    Comment: OCCASIONAL  . Drug use: No  . Sexual activity: Not on file  Other Topics Concern  . Not on file  Social  History Narrative   Lives at home with his family.   Right-handed.   3-4 cups caffeine per day.   Social Determinants of Health   Financial Resource Strain:   . Difficulty of Paying Living Expenses:   Food Insecurity:   . Worried About Programme researcher, broadcasting/film/video in the Last Year:   . Barista in the Last Year:   Transportation Needs:   . Freight forwarder (Medical):   Marland Kitchen Lack of Transportation (Non-Medical):   Physical Activity:   . Days of Exercise per Week:   . Minutes of Exercise per Session:   Stress:   . Feeling of Stress :   Social Connections:   . Frequency of Communication with Friends and Family:   . Frequency of Social Gatherings with Friends and Family:   . Attends Religious Services:   . Active Member of Clubs or Organizations:   . Attends Banker Meetings:   Marland Kitchen Marital Status:   Intimate Partner Violence:   . Fear of Current or Ex-Partner:   . Emotionally Abused:   Marland Kitchen Physically Abused:   . Sexually Abused:      PHYSICAL EXAM   Vitals:   04/21/20 1256  BP: 124/79  Pulse: 74  Weight: 213 lb 8 oz (96.8 kg)  Height: 5\' 10"  (1.778 m)   Not recorded     Body mass index is 30.63 kg/m.  PHYSICAL EXAMNIATION: He has mild left shoulder elevation, mild retrocollis, moderate to severe right turn, mild left tilt, significant  hypertrophy of right upper cervical paraspinal muscles, tenderness on palpitation, good range of motion, frequent no-no head titubation  ASSESSMENT AND PLAN  Keith Faulkner is a 49 y.o. male   Cervical dystonia: He has mild left shoulder elevation, mild retrocollis, moderate to severe right turn, mild left tilt, significant  hypertrophy of right upper cervical paraspinal muscles, tenderness on palpitation, good range of motion   We used Xeomin 200 units (100 units dissolving to 1 cc normal saline)  Under EMG guidance  Left levator scapular 25 units Right splenius capitis 25x2= 50 units Right longissimus capitis 25  units Right splenius cervix 25x2=50 units  Right posterior scalenus 25 units   Left sternocleidomastoid 25 units  54, M.D. Ph.D.  [facility] [address], [address] [place], [place] Ph: [phone] Fax: [phone]  CC: Referring Provider Addendum: I reviewed [facility] evaluation by Dr. [phone] on June 16, 2020, completed a diagnosis of cervical dystonia, suggest continue Botox injection, not a candidate for deep brain stimulation yet, may try clonazepam or Artane as needed, physical therapy,

## 2020-04-21 NOTE — Progress Notes (Signed)
**  Xeomin 100 units x 2 vials, NDC 0259-1610-01, Lot [id number], Exp 06/2021, office supply.//mck,rn** 

## 2020-04-24 DIAGNOSIS — G243 Spasmodic torticollis: Secondary | ICD-10-CM

## 2020-04-24 MED ORDER — INCOBOTULINUMTOXINA 100 UNITS IM SOLR
200.0000 [IU] | INTRAMUSCULAR | Status: DC
  Administered 2020-04-24: 200 [IU] via INTRAMUSCULAR

## 2020-05-03 ENCOUNTER — Ambulatory Visit: Payer: BC Managed Care – PPO | Admitting: Neurology

## 2020-07-14 ENCOUNTER — Telehealth: Payer: Self-pay | Admitting: Neurology

## 2020-07-14 NOTE — Telephone Encounter (Signed)
Patient has a Xeomin injection on 10/20. Patient's insurance, [employer] states that PA is through [place] [phone]). I called and spoke with Illiana to get PA for [phone]. Melody Comas states PA is approved for 200U for G24.3. PA #[id number] (07/14/20- 10/14/20). I asked her if she knew which SP patient should use. She states I would need to call patient's insurace.

## 2020-07-27 ENCOUNTER — Telehealth: Payer: Self-pay | Admitting: Neurology

## 2020-07-27 ENCOUNTER — Encounter: Payer: Self-pay | Admitting: Neurology

## 2020-07-27 ENCOUNTER — Other Ambulatory Visit: Payer: Self-pay

## 2020-07-27 ENCOUNTER — Ambulatory Visit: Payer: BC Managed Care – PPO | Admitting: Neurology

## 2020-07-27 VITALS — BP 122/75 | HR 79 | Ht 70.0 in | Wt 209.0 lb

## 2020-07-27 DIAGNOSIS — G243 Spasmodic torticollis: Secondary | ICD-10-CM

## 2020-07-27 DIAGNOSIS — F5104 Psychophysiologic insomnia: Secondary | ICD-10-CM

## 2020-07-27 MED ORDER — TRAZODONE HCL 50 MG PO TABS: 100.0000 mg | ORAL_TABLET | Freq: Every day | ORAL | 11 refills | Status: DC

## 2020-07-27 MED ORDER — INCOBOTULINUMTOXINA 100 UNITS IM SOLR
200.0000 [IU] | INTRAMUSCULAR | Status: DC
  Administered 2020-07-27: 200 [IU] via INTRAMUSCULAR

## 2020-07-27 NOTE — Telephone Encounter (Signed)
Noted  

## 2020-07-27 NOTE — Progress Notes (Signed)
**  Xeomin 100 units x 2 vials, NDC 0259-1610-01, Lot [id number], Exp 09/2021, office supply.//mck,rn** 

## 2020-07-27 NOTE — Telephone Encounter (Signed)
I may only use 100 units xeomin at next injection, I will evaluate patient first

## 2020-07-27 NOTE — Progress Notes (Signed)
PATIENT: Keith Faulkner DOB: Apr 21, 1971  Chief Complaint  Patient presents with  . Cervical Dystonia    Xeomin     HISTORICAL  HISTORICAL  Keith Faulkner is a 49 year old male, seen in request by Dr. Barnett Abu for evaluation of abnormal neck posturing  He was previously healthy, without clear triggers, he began to notice abnormal neck posturing since summer 2019, he has difficulty finding a comfortable position, later noticed constant neck pulling towards the left shoulder, leaning backwards, slow progressive, denies significant radiating pain to bilateral shoulder, or upper extremity, he denies gait abnormality  He was seen by chiropractor, with only minimal improvement, was later referred to have MRI of cervical spine [facility] in January 2020, C1-2, mild counterclockwise rotation of C1 relative to mild clockwise rotation of C2, with corresponding mild posterior positioning of the right lateral mass of C1 relative to right lateral mass of C2, mild multilevel degenerative changes, there was no significant canal or foraminal narrowing, no evidence of cord compression.  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, potential side effect explained, He was noted to have mild left shoulder elevation, mild retrocollis, moderate right turn, mild left tilt, significant atrophy of right upper cervical paraspinal muscles, tenderness,  UPDATE March 22 2020: Reported 80% improvement with his first EMG guided Xeomin injection in May 2020, he lost follow-up due to concern of pandemic, he noticed increased abnormal neck posturing, forceful turning towards the right side, also head titubation, want to resume Xeomin injection, there was no significant side effect from previous injectionI called the patient and scheduled his Xeomin injection for 04/21/20.  UPDATE April 24 2020: He did  not continue 3 months scheduled Xeomin injection for his cervical dystonia in 2020, despite significant improvement in May 2020, he has a lot of cervical dystonia related questions, we have discussed the treatment option, decided to continue injection now, we are also seeking second opinion, I have referred him to [facility] neuromuscular clinic The most bothersome symptoms from his cervical dystonia are frequent head titubation, which often noticed by his colleagues, he also complains of posterior neck muscle strain, stiffness,  UPDATE Jul 27 2020: I reviewed [facility] evaluation by Dr. Michaell Cowing on June 16, 2020, confirmed diagnosis of cervical dystonia, suggest continue Botox injection, not a candidate for deep brain stimulation yet, may try clonazepam or Artane as needed, physical therapy,  Reported 80% improvement to his previous injection, had much less head titubation following injection, no significant side effect noted  He has anxiety, complains of insomnia, he was given prescription of clonazepam 0.5 mg every night, which has helped him sleep,  REVIEW OF SYSTEMS: Full 14 system review of systems performed and notable only for as above. All other review of systems were negative.  ALLERGIES: No Known Allergies  HOME MEDICATIONS: Current Outpatient Medications  Medication Sig Dispense Refill  . clonazePAM (KLONOPIN) 0.5 MG tablet Take 0.5 mg by mouth at bedtime.    . incobotulinumtoxinA (XEOMIN) 100 units SOLR injection Inject 200 Units into the muscle every 3 (three) months.     No current facility-administered medications for this visit.    PAST MEDICAL HISTORY: Past Medical History:  Diagnosis Date  . Degenerative disc disease, cervical   . Torticollis     PAST SURGICAL HISTORY: History reviewed. No pertinent surgical history.  FAMILY HISTORY: Family History  Problem Relation  Age of Onset  . Ovarian cancer Mother   . Other Father        degenerative  disc disease  . Colon cancer Neg Hx     SOCIAL HISTORY: Social History   Socioeconomic History  . Marital status: Married    Spouse name: Not on file  . Number of children: 2  . Years of education: college  . Highest education level: Not on file  Occupational History  . Not on file  Tobacco Use  . Smoking status: Never Smoker  . Smokeless tobacco: Never Used  Substance and Sexual Activity  . Alcohol use: Yes    Alcohol/week: 3.0 standard drinks    Types: 3 Glasses of wine per week    Comment: OCCASIONAL  . Drug use: No  . Sexual activity: Not on file  Other Topics Concern  . Not on file  Social History Narrative   Lives at home with his family.   Right-handed.   3-4 cups caffeine per day.   Social Determinants of Health   Financial Resource Strain:   . Difficulty of Paying Living Expenses: Not on file  Food Insecurity:   . Worried About Programme researcher, broadcasting/film/video in the Last Year: Not on file  . Ran Out of Food in the Last Year: Not on file  Transportation Needs:   . Lack of Transportation (Medical): Not on file  . Lack of Transportation (Non-Medical): Not on file  Physical Activity:   . Days of Exercise per Week: Not on file  . Minutes of Exercise per Session: Not on file  Stress:   . Feeling of Stress : Not on file  Social Connections:   . Frequency of Communication with Friends and Family: Not on file  . Frequency of Social Gatherings with Friends and Family: Not on file  . Attends Religious Services: Not on file  . Active Member of Clubs or Organizations: Not on file  . Attends Banker Meetings: Not on file  . Marital Status: Not on file  Intimate Partner Violence:   . Fear of Current or Ex-Partner: Not on file  . Emotionally Abused: Not on file  . Physically Abused: Not on file  . Sexually Abused: Not on file     PHYSICAL EXAM   Vitals:   07/27/20 1332  BP: 122/75  Pulse: 79  Weight: 209 lb (94.8 kg)  Height: 5\' 10"  (1.778 m)   Not  recorded     Body mass index is 29.99 kg/m.  PHYSICAL EXAMNIATION: He has mild left shoulder elevation, mild retrocollis, moderate to severe right turn, mild left tilt, significant  hypertrophy of right upper cervical paraspinal muscles, tenderness on palpitation, good range of motion, frequent no-no head titubation  ASSESSMENT AND PLAN  Keith Faulkner is a 49 y.o. male   Cervical dystonia:  He has mild left shoulder elevation, mild retrocollis, mild right turn, mild left tilt, significant  hypertrophy of right upper cervical paraspinal muscles,   We used Xeomin 200 units (100 units dissolving to 1 cc normal saline)  Under EMG guidance  Left levator scapular 25 units Left sternocleidomastoid 25 units   Right splenius capitis 25x2= 50 units  Right longissimus capitis 25 units Right splenius cervix 25x2=50 units  Right iliocostalis 25 units  Chronic insomnia  Start trazodone 50 mg up to 2 tablets every night for chronic insomnia  Only use clonazepam 0.5 mg as needed  54, M.D. Ph.D.  [facility] [phone] 3rd  [address], [address] [place], [place] [address] Ph: [phone] Fax: [phone]  CC: Referring Provider

## 2020-08-22 ENCOUNTER — Ambulatory Visit: Payer: BC Managed Care – PPO | Admitting: Physical Therapy

## 2020-08-26 ENCOUNTER — Ambulatory Visit
Payer: BC Managed Care – PPO | Attending: Student in an Organized Health Care Education/Training Program | Admitting: Physical Therapy

## 2020-08-26 ENCOUNTER — Encounter: Payer: Self-pay | Admitting: Physical Therapy

## 2020-08-26 ENCOUNTER — Other Ambulatory Visit: Payer: Self-pay

## 2020-08-26 DIAGNOSIS — M436 Torticollis: Secondary | ICD-10-CM | POA: Insufficient documentation

## 2020-08-26 DIAGNOSIS — R293 Abnormal posture: Secondary | ICD-10-CM

## 2020-08-26 NOTE — Patient Instructions (Signed)
Access Code: [email or web address] URL: [email or web address] Date: 08/26/2020 Prepared by: Lonia Blood  Exercises Seated Upper Trapezius Stretch - 2-3 x daily - 5 x weekly - 1 sets - 3 reps - 15-30 sec hold Seated Shoulder Rolls - 2-3 x daily - 7 x weekly - 1 sets - 10 reps

## 2020-08-26 NOTE — Therapy (Signed)
[facility] [address] [place], [place], [address] Phone: [phone]   Fax:  [phone]  Physical Therapy Evaluation  Patient Details  Name: Keith Faulkner MRN: [id number] Date of Birth: 07/21/1971 Referring Provider (PT): Moreen Fowler, MD   Encounter Date: 08/26/2020   PT End of Session - 08/26/20 2026    Visit Number 1    Number of Visits 5    Authorization Type BCBS    PT Start Time 0720    PT Stop Time 0805    PT Time Calculation (min) 45 min    Activity Tolerance Patient tolerated treatment well    Behavior During Therapy [facility] for tasks assessed/performed           Past Medical History:  Diagnosis Date  . Degenerative disc disease, cervical   . Torticollis     History reviewed. No pertinent surgical history.  There were no vitals filed for this visit.    Subjective Assessment - 08/26/20 0722    Subjective About 2-3 years ago, started noted pain in back and neck.  Initially started with chiropractor, but I didn't know what was causing it.  Saw Dr. Terrace Arabia and the neurologist at [facility].  Got a TENS unit based on the recommendaiton from the [facility].  Have tendency to turn head to R.  No pain at the end of the day; more soreness at the end of the day.    Patient Stated Goals Would like to get a continued regimine of exercise to keep in better position.    Currently in Pain? No/denies              [facility] PT Assessment - 08/26/20 0726      Assessment   Medical Diagnosis cervical dystonia    Referring Provider (PT) Moreen Fowler, MD    Onset Date/Surgical Date 06/16/20   referral   Hand Dominance Right      Precautions   Precautions None      Balance Screen   Has the patient fallen in the past 6 months No    Has the patient had a decrease in activity level because of a fear of falling?  No    Is the patient reluctant to leave their home because of a fear of falling?  No       Home Environment   Living Environment Private residence    Living Arrangements Spouse/significant other;Children    Available Help at Discharge Family    Type of Home House      Prior Function   Level of Independence Independent    Vocation Full time employment    Market researcher; computer based and on phone    Leisure Has done light intensity exercise in the past       Posture/Postural Control   Posture/Postural Control Postural limitations    Postural Limitations Rounded Shoulders    Posture Comments R shoulder lower than L; pt holds head in tilted to L, and rotated to R.  Resting tremor noted at head.      ROM / Strength   AROM / PROM / Strength Strength;AROM      AROM   AROM Assessment Site Cervical    Cervical Flexion 40    Cervical Extension 50    Cervical - Right Side Bend 50    Cervical - Left Side Bend 42    Cervical - Right Rotation 55    Cervical - Left Rotation 51  Strength   Overall Strength Comments Grossly tested 5/5 throughout BUEs      Palpation   Palpation comment Tender to palpation along B upper traps; tightness noted B upper traps, rhomboids.  With palpation along cervical/thoracic paraspinals/spinous processes, hypomobility noted lower cervical/upperand mid thoracic spine.                      Objective measurements completed on examination: See above findings.               PT Education - 08/26/20 2033    Education Details Initiated HEP, PT POC and eval results.  Cervcial dystonia and how we plan to treat    Person(s) Educated Patient    Methods Explanation;Demonstration;Handout    Comprehension Verbalized understanding;Returned demonstration               PT Long Term Goals - 08/26/20 2036      PT LONG TERM GOAL #1   Title Pt will be independent with HEP for improved flexibility, postural strengthening.  TARGET 09/23/2020    Time 4    Period Weeks    Status New      PT LONG TERM GOAL #2     Title Pt will improve neck flexion by at least 10 degrees for improved neck flexibility.    Baseline 40 degrees    Time 4    Period Weeks    Status New      PT LONG TERM GOAL #3   Title Pt will improve neck rotation R and L by at least 10 degrees for improved neck flexibility.    Baseline 55 R, 51 L    Time 4    Period Weeks    Status New      PT LONG TERM GOAL #4   Title Pt will report less fatigue/tired/stiffness through neck and cervical spine at end of the day, by 25% for improved overall posture, flexibility.    Time 4    Period Weeks    Status New      PT LONG TERM GOAL #5   Title Pt will verbalize at least 3 means to manage cervical dystonia, for optimal posture and fucntional mobility thorughout the day.    Time 4    Period Weeks    Status New                  Plan - 08/26/20 2027    Clinical Impression Statement Pt is a 49 year old male with diagnosis of cervical dystonia, with recent visits to both local neurologist and neurologist at [facility].  He is currently not taking any medication; symtpoms of stiffness and pain in neck began about 2 years ago.  Main complaint is sore/tired musclses at the end of the day; no pain complaints at eval.  He presents with abnormal posture, decreased cervical spine flexibility, tenderness to palpation along upper traps, hypomobility cervical spine.  He currently works full-time, enjoys light recreation activities.  He would benefit from skilled PT to address the above stated deficits to improve overall functional mobility.    Personal Factors and Comorbidities Comorbidity 2    Comorbidities abdominal pain, chronic insomnia    Examination-Activity Limitations Reach Overhead;Other   posture, fatigue at end of day   Examination-Participation Restrictions Occupation    Stability/Clinical Decision Making Stable/Uncomplicated    Clinical Decision Making Low    Rehab Potential Good    PT Frequency 1x / week  PT Duration 4 weeks   plus  eval   PT Treatment/Interventions ADLs/Self Care Home Management;Electrical Stimulation;Ultrasound;Traction;Neuromuscular re-education;Therapeutic exercise;Therapeutic activities;Patient/family education;Manual techniques;Passive range of motion    PT Next Visit Plan Review initial HEP and continue to update for cervical spine flexibility, postural strenghtening; cervical/thoracic mobs if needed; stretching    Consulted and Agree with Plan of Care Patient           Patient will benefit from skilled therapeutic intervention in order to improve the following deficits and impairments:  Decreased range of motion, Hypomobility, Postural dysfunction  Visit Diagnosis: Stiffness of neck  Abnormal posture     Problem List Patient Active Problem List   Diagnosis Date Noted  . Chronic insomnia 07/27/2020  . 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 Abdominal pain, epigastric 10/17/2011    Margarito Dehaas W. 08/26/2020, 8:39 PM  [name], PT   [facility] [address] [place], [place], [address] Phone: [phone]   Fax:  [phone]  Name: Keith Faulkner MRN: [id number] Date of Birth: 06/24/1971

## 2020-09-09 ENCOUNTER — Ambulatory Visit
Payer: BC Managed Care – PPO | Attending: Student in an Organized Health Care Education/Training Program | Admitting: Physical Therapy

## 2020-09-09 ENCOUNTER — Other Ambulatory Visit: Payer: Self-pay

## 2020-09-09 DIAGNOSIS — R293 Abnormal posture: Secondary | ICD-10-CM | POA: Diagnosis present

## 2020-09-09 DIAGNOSIS — M436 Torticollis: Secondary | ICD-10-CM | POA: Insufficient documentation

## 2020-09-09 NOTE — Therapy (Signed)
[facility] [address] [place], [place], [address] Phone: [phone]   Fax:  [phone]  Physical Therapy Treatment  Patient Details  Name: Keith Faulkner MRN: [id number] Date of Birth: 1970/10/29 Referring Provider (PT): Moreen Fowler, MD   Encounter Date: 09/09/2020   PT End of Session - 09/09/20 1039    Visit Number 2    Number of Visits 5    Authorization Type BCBS    PT Start Time 0803    PT Stop Time 0846    PT Time Calculation (min) 43 min    Activity Tolerance Patient tolerated treatment well    Behavior During Therapy [facility] for tasks assessed/performed           Past Medical History:  Diagnosis Date  . Degenerative disc disease, cervical   . Torticollis     No past surgical history on file.  There were no vitals filed for this visit.   Subjective Assessment - 09/09/20 0803    Subjective No changes, no pain today.  Mostly the stiffness and tightness, more towards the end of the day.  Brought in my exercises.  Been trying to doing more stretches through the day.    Patient Stated Goals Would like to get a continued regimine of exercise to keep in better position.    Currently in Pain? No/denies             Reviewed HEP from last visit as well as exercises given from [facility] PT, with pt return demo understanding:  Seated Upper Trapezius Stretch - 2-3 x daily - 5 x weekly - 1 sets - 3 reps - 15-30 sec hold Seated Shoulder Rolls - 2-3 x daily - 7 x weekly - 1 sets - 10 reps Open Book stretch in sidelying, 10 reps (performed and verbally cued pt to perform both sides, as he is only performing RUE in L sidelying at home) Supine bilateral shoulder flexion x 10 reps     Performed the following exercises and added to HEP: For supine exercises, PT provides initial cues for positioning of neck in neutral as he is rotated slightly to R/sidebend slightly to L Supine Chin Tuck - 1 x daily - 5  x weekly - 1-2 sets - 10 reps Supine Scapular Retraction - 1 x daily - 5 x weekly - 1-2 sets - 10 reps Seated Cervical Retraction - 1 x daily - 5 x weekly - 1-2 sets - 10 reps - 3 sec hold  -Initially tried at wall with towel, but pt able to perform without need of wall or towel for tactile cues Seated PWR! Up with forward lean to upright posture with scapular retraction, x 10 reps (PWR! Moves handout) Standing Cervical Rotation AROM with Overpressure - 1 x daily - 5 x weekly - 1 sets - 5 reps - 3 sec hold  Additional exercises worked on in session: Supine PWR! Rock, reaching up and across body with cues to look at hand, x 8 reps each side Supine PWR! Twist, reaching across to clap, with cues to look at hand, x 6 reps each side Supine passive stretch for upper traps/levator, 3 reps x 20 sec hold  Seated scapular retraction with bent elbows, x 3 reps (opted for seated PWR! Up instead for better positioning) Seated neck sidebending x 5 reps each side                      PT Education -  09/09/20 1037    Education Details Updates to HEP; answered pt's question regarding TENS electrodes-try to call number in his unit or order online    Person(s) Educated Patient    Methods Explanation;Demonstration    Comprehension Verbalized understanding;Returned demonstration               PT Long Term Goals - 08/26/20 2036      PT LONG TERM GOAL #1   Title Pt will be independent with HEP for improved flexibility, postural strengthening.  TARGET 09/23/2020    Time 4    Period Weeks    Status New      PT LONG TERM GOAL #2   Title Pt will improve neck flexion by at least 10 degrees for improved neck flexibility.    Baseline 40 degrees    Time 4    Period Weeks    Status New      PT LONG TERM GOAL #3   Title Pt will improve neck rotation R and L by at least 10 degrees for improved neck flexibility.    Baseline 55 R, 51 L    Time 4    Period Weeks    Status New      PT  LONG TERM GOAL #4   Title Pt will report less fatigue/tired/stiffness through neck and cervical spine at end of the day, by 25% for improved overall posture, flexibility.    Time 4    Period Weeks    Status New      PT LONG TERM GOAL #5   Title Pt will verbalize at least 3 means to manage cervical dystonia, for optimal posture and fucntional mobility thorughout the day.    Time 4    Period Weeks    Status New                 Plan - 09/09/20 1039    Clinical Impression Statement Skilled PT session today focused on stretching, flexibility and postural strengthening in supine, sitting, and standing.  He return demo current HEP, and feels that the exercises help with his stiffiness.  He will continue to benefit from additional skilled PT sessions to address posture, stiffness and flexibility.    Personal Factors and Comorbidities Comorbidity 2    Comorbidities abdominal pain, chronic insomnia    Examination-Activity Limitations Reach Overhead;Other   posture, fatigue at end of day   Examination-Participation Restrictions Occupation    Stability/Clinical Decision Making Stable/Uncomplicated    Rehab Potential Good    PT Frequency 1x / week    PT Duration 4 weeks   plus eval   PT Treatment/Interventions ADLs/Self Care Home Management;Electrical Stimulation;Ultrasound;Traction;Neuromuscular re-education;Therapeutic exercise;Therapeutic activities;Patient/family education;Manual techniques;Passive range of motion    PT Next Visit Plan Review updates to HEP and continue to update for cervical spine flexibility, postural strenghtening; cervical/thoracic mobs if needed; stretching.  Make sure pt is holding (and relaxing) at end ranges for optimal stretch.  Educate in techniques for relaxation/mindfulness    Consulted and Agree with Plan of Care Patient           Patient will benefit from skilled therapeutic intervention in order to improve the following deficits and impairments:   Decreased range of motion, Hypomobility, Postural dysfunction  Visit Diagnosis: Stiffness of neck  Abnormal posture     Problem List Patient Active Problem List   Diagnosis Date Noted  . Chronic insomnia 07/27/2020  . 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 Abdominal pain, epigastric 10/17/2011  Raghav Verrilli W. 09/09/2020, 10:49 AM  [name], PT   [facility] [address] [place], [place], [address] Phone: [phone]   Fax:  [phone]  Name: Renee Beale MRN: [id number] Date of Birth: November 02, 1970

## 2020-09-09 NOTE — Patient Instructions (Addendum)
Access Code: [email or web address] URL: [email or web address] Date: 09/09/2020 Prepared by: Lonia Blood  Exercises Seated Upper Trapezius Stretch - 2-3 x daily - 5 x weekly - 1 sets - 3 reps - 15-30 sec hold Seated Shoulder Rolls - 2-3 x daily - 7 x weekly - 1 sets - 10 reps Also has open book stretch and supine shoulder flexion  Added 09/09/2020 Supine Chin Tuck - 1 x daily - 5 x weekly - 1-2 sets - 10 reps Supine Scapular Retraction - 1 x daily - 5 x weekly - 1-2 sets - 10 reps Seated Cervical Retraction - 1 x daily - 5 x weekly - 1-2 sets - 10 reps - 3 sec hold Standing Cervical Rotation AROM with Overpressure - 1 x daily - 5 x weekly - 1 sets - 5 reps - 3 sec hold  Provided handout for seated PWR! Up x 10 reps

## 2020-09-16 ENCOUNTER — Ambulatory Visit: Payer: BC Managed Care – PPO | Admitting: Physical Therapy

## 2020-09-16 ENCOUNTER — Other Ambulatory Visit: Payer: Self-pay

## 2020-09-16 DIAGNOSIS — M436 Torticollis: Secondary | ICD-10-CM | POA: Diagnosis not present

## 2020-09-16 DIAGNOSIS — R293 Abnormal posture: Secondary | ICD-10-CM

## 2020-09-16 NOTE — Patient Instructions (Signed)
Access Code: [email or web address] URL: [email or web address] Date: 09/16/2020 Prepared by: Lonia Blood  Exercises Seated Upper Trapezius Stretch - 2-3 x daily - 5 x weekly - 1 sets - 3 reps - 15-30 sec hold Seated Shoulder Rolls - 2-3 x daily - 7 x weekly - 1 sets - 10 reps Supine Chin Tuck - 1 x daily - 5 x weekly - 1-2 sets - 10 reps Supine Scapular Retraction - 1 x daily - 5 x weekly - 1-2 sets - 10 reps Seated Cervical Retraction - 1 x daily - 5 x weekly - 1-2 sets - 10 reps - 3 sec hold  Added 09/16/2020: Standing Cervical Rotation AROM with Overpressure - 1 x daily - 5 x weekly - 1 sets - 5 reps - 3 sec hold Seated Assisted Cervical Rotation with Towel - 1 x daily - 7 x weekly - 1 sets - 3 reps - 10-15 sec hold Seated Cervical Sidebending Stretch - 1 x daily - 7 x weekly - 1 sets - 3 reps - 10-15 sec hold

## 2020-09-16 NOTE — Therapy (Signed)
[facility] [address] [place], [place], [address] Phone: [phone]   Fax:  [phone]  Physical Therapy Treatment  Patient Details  Name: Keith Faulkner MRN: [id number] Date of Birth: Dec 17, 1970 Referring Provider (PT): Moreen Fowler, MD   Encounter Date: 09/16/2020   PT End of Session - 09/16/20 0847    Visit Number 3    Number of Visits 5    Authorization Type BCBS    PT Start Time 0846    PT Stop Time 0931    PT Time Calculation (min) 45 min    Activity Tolerance Patient tolerated treatment well    Behavior During Therapy [facility] for tasks assessed/performed           Past Medical History:  Diagnosis Date  . Degenerative disc disease, cervical   . Torticollis     No past surgical history on file.  There were no vitals filed for this visit.   Subjective Assessment - 09/16/20 0848    Subjective No changes; been working on stretching a little more through the day.  Stretching helps loosen me up a bit.    Patient Stated Goals Would like to get a continued regimine of exercise to keep in better position.    Currently in Pain? No/denies                             [facility] Adult PT Treatment/Exercise - 09/16/20 0001      Self-Care   Self-Care Other Self-Care Comments    Other Self-Care Comments  Discussed additional means to help with muscle fatigue at the end of the day:  use of heating pad or hot packs to relax the muscles, in addition to TENS use.  Also discussed using time at the end of the day to incorporate relaxation, mindfulness/meditation for brain/body preparation.  Also discussed posture and positioning at work space; time for posture breaks through the day.      Exercises   Exercises Neck      Neck Exercises: Seated   Cervical Rotation Right;Left   3 reps   Cervical Rotation Limitations Used towel for additional stretch, 10 sec    Lateral Flexion Right;Left;5  reps    Lateral Flexion Limitations hold at end range, 10 seconds    Shoulder Rolls Backwards;5 reps   cues for use as posture break during work hours   Other Seated Exercise Seated PWR! Up x 5 reps (cues for proper positioning of UEs and for scapular retraction), then PWR! Rock x 5 reps each side, reach and look at hand; PWR! Twist x 5 reps each side.  All performed at edge of mat, cues for head motion to look to hands.    Other Seated Exercise Seated lateral neck flexion stretch with additional stretch, hands behind back, 3 reps each side x 10 second hold          Reviewed HEP from last visit, with pt return demo understanding.  Supine Chin Tuck - 1 x daily - 5 x weekly - 1-2 sets - 10 reps Supine Scapular Retraction - 1 x daily - 5 x weekly - 1-2 sets - 10 reps Seated Cervical Retraction - 1 x daily - 5 x weekly - 1-2 sets - 10 reps - 3 sec hold Standing Cervical Rotation AROM with Overpressure - 1 x daily - 5 x weekly - 1 sets - 5 reps - 3 sec hold  Supine  position passive stretching for upper traps, neck rotation stretch, 5 reps each side, then 3 additional reps with gentle massage along upper traps. Sidebending stretch in supine, 3 reps, with gentle massage along SCM Stretch/distraction at occiput with pt in chin tuck position, 5 reps  ROM measures today in rotation:  R 55 degrees (unchanged); L 60 degrees (improved from 51 degree) Sidebending (40 degrees both sides after initial stretches)        PT Education - 09/16/20 1152    Education Details Additions to HEP; posture/positioning with work space; ways to relax muscles in shoulder area at end of day    Person(s) Educated Patient    Methods Explanation;Demonstration;Handout    Comprehension Verbalized understanding;Returned demonstration               PT Long Term Goals - 08/26/20 2036      PT LONG TERM GOAL #1   Title Pt will be independent with HEP for improved flexibility, postural strengthening.  TARGET  09/23/2020    Time 4    Period Weeks    Status New      PT LONG TERM GOAL #2   Title Pt will improve neck flexion by at least 10 degrees for improved neck flexibility.    Baseline 40 degrees    Time 4    Period Weeks    Status New      PT LONG TERM GOAL #3   Title Pt will improve neck rotation R and L by at least 10 degrees for improved neck flexibility.    Baseline 55 R, 51 L    Time 4    Period Weeks    Status New      PT LONG TERM GOAL #4   Title Pt will report less fatigue/tired/stiffness through neck and cervical spine at end of the day, by 25% for improved overall posture, flexibility.    Time 4    Period Weeks    Status New      PT LONG TERM GOAL #5   Title Pt will verbalize at least 3 means to manage cervical dystonia, for optimal posture and fucntional mobility thorughout the day.    Time 4    Period Weeks    Status New                 Plan - 09/16/20 1153    Clinical Impression Statement Continued with postural stretching and strengthening today, in supine, sitting, standing.  Cues provided through session for improved awareness of resting neck position towards more neutral, away from R side rotation/L sidebending position.  He appears to be performing current HEP at home and continues to progress towards goals.    Personal Factors and Comorbidities Comorbidity 2    Comorbidities abdominal pain, chronic insomnia    Examination-Activity Limitations Reach Overhead;Other   posture, fatigue at end of day   Examination-Participation Restrictions Occupation    Stability/Clinical Decision Making Stable/Uncomplicated    Rehab Potential Good    PT Frequency 1x / week    PT Duration 4 weeks   plus eval   PT Treatment/Interventions ADLs/Self Care Home Management;Electrical Stimulation;Ultrasound;Traction;Neuromuscular re-education;Therapeutic exercise;Therapeutic activities;Patient/family education;Manual techniques;Passive range of motion    PT Next Visit Plan  Review updates to HEP and continue to address postural strenghtening; cervical/thoracic mobs if needed; stretching.  Make sure pt is holding (and relaxing) at end ranges for optimal stretch.  PWR! Moves in sitting (?add to HEP)    Consulted and Agree with  Plan of Care Patient           Patient will benefit from skilled therapeutic intervention in order to improve the following deficits and impairments:  Decreased range of motion,Hypomobility,Postural dysfunction  Visit Diagnosis: Abnormal posture  Stiffness of neck     Problem List Patient Active Problem List   Diagnosis Date Noted  . Chronic insomnia 07/27/2020  . 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 Abdominal pain, epigastric 10/17/2011    Gaylon Melchor W. 09/16/2020, 11:56 AM [name], PT  [facility] [address] [place], [place], [address] Phone: [phone]   Fax:  [phone]  Name: Keith Faulkner MRN: [id number] Date of Birth: August 06, 1971

## 2020-09-23 ENCOUNTER — Ambulatory Visit: Payer: BC Managed Care – PPO | Admitting: Physical Therapy

## 2020-09-27 ENCOUNTER — Encounter: Payer: Self-pay | Admitting: Physical Therapy

## 2020-09-27 ENCOUNTER — Ambulatory Visit: Payer: BC Managed Care – PPO | Admitting: Physical Therapy

## 2020-09-27 ENCOUNTER — Other Ambulatory Visit: Payer: Self-pay

## 2020-09-27 DIAGNOSIS — R293 Abnormal posture: Secondary | ICD-10-CM

## 2020-09-27 DIAGNOSIS — M436 Torticollis: Secondary | ICD-10-CM | POA: Diagnosis not present

## 2020-09-27 NOTE — Patient Instructions (Signed)
Handouts for:  PWR! Up in sitting x 20  PWR! Rock in sitting x 20  PWR! Moves in prone, x 10 reps each

## 2020-09-27 NOTE — Therapy (Signed)
[facility] [address] [place], [place], [address] Phone: [phone]   Fax:  [phone]  Physical Therapy Treatment  Patient Details  Name: Keith Faulkner MRN: [id number] Date of Birth: 1971-06-21 Referring Provider (PT): Moreen Fowler, MD   Encounter Date: 09/27/2020   PT End of Session - 09/27/20 1223    Visit Number 4    Number of Visits 5    Authorization Type BCBS    PT Start Time 0804    PT Stop Time 0848    PT Time Calculation (min) 44 min    Activity Tolerance Patient tolerated treatment well    Behavior During Therapy [facility] for tasks assessed/performed           Past Medical History:  Diagnosis Date  . Degenerative disc disease, cervical   . Torticollis     History reviewed. No pertinent surgical history.  There were no vitals filed for this visit.   Subjective Assessment - 09/27/20 0808    Subjective Things staying pretty good.  Just not as stiff, I think.    Patient Stated Goals Would like to get a continued regimine of exercise to keep in better position.    Currently in Pain? No/denies               Reviewed HEP, with pt return demo understanding: (Seated) Cervical Rotation AROM with Overpressure - 1 x daily - 5 x weekly - 1 sets - 5 reps - 3 sec hold Seated Assisted Cervical Rotation with Towel - 1 x daily - 7 x weekly - 1 sets - 3 reps - 10-15 sec hold Seated Cervical Sidebending Stretch - 1 x daily - 7 x weekly - 1 sets - 3 reps - 10-15 sec hold   With sidebending stretch, cues to relax at end range and breathe, and cues for gentle stretch and hold for all stretches  Prone PWR! Up:  5 reps, then 2nd set of head turns upon prone on mat, 5 reps each side PWR! Rock x 5 reps each side, then added alt UE/LE lift x 5 reps Sidelying position open book stretch (PWR! Twist) x 8 reps each side Prone PWR! Step x 5 reps each side                [facility] Adult PT  Treatment/Exercise - 09/27/20 0001      Neck Exercises: Theraband   Scapula Retraction 10 reps;Red    Scapula Retraction Limitations Seated, initial cues for technique    Other Theraband Exercises UE resisted diagonals, external rotation and flexion, red theraband, 10 reps, each UE, sitting      Neck Exercises: Seated   Other Seated Exercise Seated PWR! Up x 5 reps (cues for proper positioning of UEs and for scapular retraction), then PWR! Rock x 5 reps each side, reach and look at hand; PWR! Twist x 5 reps each side.  All performed at edge of mat, cues for head motion to look to hands.      Neck Exercises: Supine   Other Supine Exercise Shoulder retraction x 10 reps    Other Supine Exercise Contract relax 3 second hold, in R cervical rotation, then relax into increased L cervical rotation, 2 sets x 5 reps           Supine R upper traps stretch, 3 reps, 30 sec hold Cervical retraction (chin tuck) position with stretch through cervical spine, at occiput for stretch, 5 reps, 10 sec  PT Education - 09/27/20 1222    Education Details PWR! Moves Sitting and PWR! Moves prone added to HEP for muscle strengthening, postural awareness    Person(s) Educated Patient    Methods Explanation;Demonstration;Handout    Comprehension Verbalized understanding;Returned demonstration               PT Long Term Goals - 08/26/20 2036      PT LONG TERM GOAL #1   Title Pt will be independent with HEP for improved flexibility, postural strengthening.  TARGET 09/23/2020    Time 4    Period Weeks    Status New      PT LONG TERM GOAL #2   Title Pt will improve neck flexion by at least 10 degrees for improved neck flexibility.    Baseline 40 degrees    Time 4    Period Weeks    Status New      PT LONG TERM GOAL #3   Title Pt will improve neck rotation R and L by at least 10 degrees for improved neck flexibility.    Baseline 55 R, 51 L    Time 4    Period Weeks    Status New      PT  LONG TERM GOAL #4   Title Pt will report less fatigue/tired/stiffness through neck and cervical spine at end of the day, by 25% for improved overall posture, flexibility.    Time 4    Period Weeks    Status New      PT LONG TERM GOAL #5   Title Pt will verbalize at least 3 means to manage cervical dystonia, for optimal posture and fucntional mobility thorughout the day.    Time 4    Period Weeks    Status New                 Plan - 09/27/20 1223    Clinical Impression Statement Review of HEP from last visit, with pt return demo understanding.  Focused today on more postural and neck strengthening exercises as well as exercises to improve awareness of resting head position.  He continues to benefit from skilled PT for improved postural strengthening and flexibility.    Personal Factors and Comorbidities Comorbidity 2    Comorbidities abdominal pain, chronic insomnia    Examination-Activity Limitations Reach Overhead;Other   posture, fatigue at end of day   Examination-Participation Restrictions Occupation    Stability/Clinical Decision Making Stable/Uncomplicated    Rehab Potential Good    PT Frequency 1x / week    PT Duration 4 weeks   plus eval   PT Treatment/Interventions ADLs/Self Care Home Management;Electrical Stimulation;Ultrasound;Traction;Neuromuscular re-education;Therapeutic exercise;Therapeutic activities;Patient/family education;Manual techniques;Passive range of motion    PT Next Visit Plan Check goals, discuss plans for d/c next visit?    Consulted and Agree with Plan of Care Patient           Patient will benefit from skilled therapeutic intervention in order to improve the following deficits and impairments:  Decreased range of motion,Hypomobility,Postural dysfunction  Visit Diagnosis: Abnormal posture  Stiffness of neck     Problem List Patient Active Problem List   Diagnosis Date Noted  . Chronic insomnia 07/27/2020  . Cervical dystonia  01/21/2019  . Abdominal pain, epigastric 10/17/2011    Keith Faulkner W. 09/27/2020, 2:06 PM  [name], PT    [facility] [address] [place], [place], [address] Phone: [phone]   Fax:  [phone]  Name:  Keith Faulkner MRN: [id number] Date of Birth: Jul 07, 1971

## 2020-10-04 ENCOUNTER — Ambulatory Visit: Payer: BC Managed Care – PPO | Admitting: Physical Therapy

## 2020-10-04 ENCOUNTER — Other Ambulatory Visit: Payer: Self-pay

## 2020-10-04 ENCOUNTER — Encounter: Payer: Self-pay | Admitting: Physical Therapy

## 2020-10-04 DIAGNOSIS — M436 Torticollis: Secondary | ICD-10-CM

## 2020-10-04 DIAGNOSIS — R293 Abnormal posture: Secondary | ICD-10-CM

## 2020-10-04 NOTE — Therapy (Signed)
[facility] [address] [place], [place], [address] Phone: [phone]   Fax:  [phone]  Physical Therapy Treatment/Discharge Summary  Patient Details  Name: Keith Faulkner MRN: [id number] Date of Birth: [birth year] Referring Provider (PT): Santiago Bur, MD   Encounter Date: 10/04/2020   PT End of Session - 10/04/20 0843    Visit Number 5    Number of Visits 5    Authorization Type BCBS    PT Start Time 0802    PT Stop Time 0833    PT Time Calculation (min) 31 min    Activity Tolerance Patient tolerated treatment well    Behavior During Therapy [facility] for tasks assessed/performed           Past Medical History:  Diagnosis Date  . Degenerative disc disease, cervical   . Torticollis     History reviewed. No pertinent surgical history.  There were no vitals filed for this visit.   Subjective Assessment - 10/04/20 0802    Subjective Had a good Christmas.  Things been going pretty good.  No pain.  Routine of exercise has improved.  The more I stick with the exercises, the better I feel.    Patient Stated Goals Would like to get a continued regimine of exercise to keep in better position.    Currently in Pain? No/denies              [facility] PT Assessment - 10/04/20 0001      AROM   AROM Assessment Site Cervical    Cervical Flexion 52    Cervical - Right Side Bend 45    Cervical - Left Side Bend 45    Cervical - Right Rotation 60    Cervical - Left Rotation 64             Reviewed most recent HEP:  Pt return demo understanding.  PWR! Up in sitting x 20 (pt performs 10 reps)   PWR! Rock in sitting x 20 (pt performs 10 reps)   PWR! Moves in prone, x 10 reps each  PWR! Up  PWR! [employer]! Twist (already performing)  PWR! Step  Reviewed seated chin tuck, neck retraction, and upper trap stretching-pt return demo understanding.             [facility] Adult PT Treatment/Exercise  - 10/04/20 0001      Self-Care   Self-Care Other Self-Care Comments    Other Self-Care Comments  Discussed progress towards goals, POC, and reviewed means to manage cervical dystonia, neck tightness:  exercises for stretching, strengthening, TENS/heat, and breathing/relaxation techniques.  Discussed plans for d/c, and situations that would warrant return to PT.                  PT Education - 10/04/20 [phone]    Education Details Progress to goals, POC, and review of strategies to manage cervical dystonia    Person(s) Educated Patient    Methods Explanation    Comprehension Verbalized understanding               PT Long Term Goals - 10/04/20 0806      PT LONG TERM GOAL #1   Title Pt will be independent with HEP for improved flexibility, postural strengthening.  TARGET 09/23/2020    Time 4    Period Weeks    Status Achieved      PT LONG TERM GOAL #2   Title Pt will improve neck flexion by  at least 10 degrees for improved neck flexibility.    Baseline 40 degrees; 52 degrees 10/04/2020    Time 4    Period Weeks    Status Achieved      PT LONG TERM GOAL #3   Title Pt will improve neck rotation R and L by at least 10 degrees for improved neck flexibility.    Baseline 55 R, 51 L; R 60 degrees, L 64 degrees    Time 4    Period Weeks    Status Partially Met      PT LONG TERM GOAL #4   Title Pt will report less fatigue/tired/stiffness through neck and cervical spine at end of the day, by 25% for improved overall posture, flexibility.    Baseline Reports 10% less than initial    Time 4    Period Weeks    Status Not Met      PT LONG TERM GOAL #5   Title Pt will verbalize at least 3 means to manage cervical dystonia, for optimal posture and fucntional mobility thorughout the day.    Baseline Reports posture breaks during the day, timing exercises right after work; TENS, heating pad    Time 4    Period Weeks    Status Achieved                 Plan - 10/04/20  0844    Clinical Impression Statement Assessed goals this visit, with pt meeting 3 of 5 LTGs.  LTG 1 met for HEP; LTG 2 for neck flexion; LTG 5 met for strategies to manage dystonia.  LTG 3 partially met for rotation ROM; LTG 4 not met for percentage improvement (pt rates 10% improvement compared to 25% goal).  Pt reports some overall improvement in management of cervical dystonia and notes improved neck ROM.  Pt has comprehensive HEP and is independent with this.  He is appropriate for d/c from PT.    Personal Factors and Comorbidities Comorbidity 2    Comorbidities abdominal pain, chronic insomnia    Examination-Activity Limitations Reach Overhead;Other   posture, fatigue at end of day   Examination-Participation Restrictions Occupation    Stability/Clinical Decision Making Stable/Uncomplicated    Rehab Potential Good    PT Frequency 1x / week    PT Duration 4 weeks   plus eval   PT Treatment/Interventions ADLs/Self Care Home Management;Electrical Stimulation;Ultrasound;Traction;Neuromuscular re-education;Therapeutic exercise;Therapeutic activities;Patient/family education;Manual techniques;Passive range of motion    PT Next Visit Plan D/C this visit, with plans for follow-up if needed.    Consulted and Agree with Plan of Care Patient           Patient will benefit from skilled therapeutic intervention in order to improve the following deficits and impairments:  Decreased range of motion,Hypomobility,Postural dysfunction  Visit Diagnosis: Stiffness of neck  Abnormal posture     Problem List Patient Active Problem List   Diagnosis Date Noted  . Chronic insomnia 07/27/2020  . 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 Abdominal pain, epigastric 10/17/2011    Keith Hursey W. 10/04/2020, 8:58 AM  Frazier Butt., PT   [facility] [address] [place], [place], [address] Phone: [phone]   Fax:  [phone]  Name: Keith Faulkner MRN: [id number] Date of Birth: 10/21/1970   PHYSICAL THERAPY DISCHARGE SUMMARY  Visits from Start of Care: 5  Current functional level related to goals / functional outcomes:  PT Long Term Goals - 10/04/20 7425      PT  LONG TERM GOAL #1   Title Pt will be independent with HEP for improved flexibility, postural strengthening.  TARGET 09/23/2020    Time 4    Period Weeks    Status Achieved      PT LONG TERM GOAL #2   Title Pt will improve neck flexion by at least 10 degrees for improved neck flexibility.    Baseline 40 degrees; 52 degrees 10/04/2020    Time 4    Period Weeks    Status Achieved      PT LONG TERM GOAL #3   Title Pt will improve neck rotation R and L by at least 10 degrees for improved neck flexibility.    Baseline 55 R, 51 L; R 60 degrees, L 64 degrees    Time 4    Period Weeks    Status Partially Met      PT LONG TERM GOAL #4   Title Pt will report less fatigue/tired/stiffness through neck and cervical spine at end of the day, by 25% for improved overall posture, flexibility.    Baseline Reports 10% less than initial    Time 4    Period Weeks    Status Not Met      PT LONG TERM GOAL #5   Title Pt will verbalize at least 3 means to manage cervical dystonia, for optimal posture and fucntional mobility thorughout the day.    Baseline Reports posture breaks during the day, timing exercises right after work; TENS, heating pad    Time 4    Period Weeks    Status Achieved          Pt has met 3 of 3 LTGs.   Remaining deficits: Decreased flexibility, abnormal tone and posture   Education / Equipment: Educated in [employer] and ways to manage dystonia  Plan: Patient agrees to discharge.  Patient goals were partially met. Patient is being discharged due to being pleased with the current functional level.  ?????    Mady Haagensen, PT 10/04/20 9:02 AM Phone: [phone] Fax: [phone]

## 2020-11-01 ENCOUNTER — Telehealth: Payer: Self-pay | Admitting: Neurology

## 2020-11-01 NOTE — Telephone Encounter (Signed)
Patient has a [facility] appointment tomorrow. His previous PA through [facility] expired 10/14/20. I called [employer] at [phone] and spoke with Georgiann Hahn to initiate continuation of PA. Request was approved. PA #[id number] (11/01/20- 01/30/21). This PA is for B/B through the medical benefit, up to 200 units.

## 2020-11-02 ENCOUNTER — Encounter: Payer: Self-pay | Admitting: Neurology

## 2020-11-02 ENCOUNTER — Ambulatory Visit: Payer: BC Managed Care – PPO | Admitting: Neurology

## 2020-11-02 VITALS — BP 126/74 | HR 68 | Ht 70.0 in | Wt 209.5 lb

## 2020-11-02 DIAGNOSIS — G243 Spasmodic torticollis: Secondary | ICD-10-CM | POA: Diagnosis not present

## 2020-11-02 MED ORDER — INCOBOTULINUMTOXINA 100 UNITS IM SOLR
100.0000 [IU] | INTRAMUSCULAR | Status: DC
  Administered 2020-11-02: 100 [IU] via INTRAMUSCULAR

## 2020-11-02 NOTE — Progress Notes (Signed)
PATIENT: Keith Faulkner DOB: 1971-02-25  Chief Complaint  Patient presents with  . Follow-up    Cervical Dystonia - Xeomin    HISTORICAL  Keith Faulkner is a 50 year old male, seen in request by Dr. Barnett Faulkner for evaluation of abnormal neck posturing  He was previously healthy, without clear triggers, he began to notice abnormal neck posturing since summer 2019, he has difficulty finding a comfortable position, later noticed constant neck pulling towards the left shoulder, leaning backwards, slow progressive, denies significant radiating pain to bilateral shoulder, or upper extremity, he denies gait abnormality  He was seen by chiropractor, with only minimal improvement, was later referred to have MRI of cervical spine [facility] in January 2020, C1-2, mild counterclockwise rotation of C1 relative to mild clockwise rotation of C2, with corresponding mild posterior positioning of the right lateral mass of C1 relative to right lateral mass of C2, mild multilevel degenerative changes, there was no significant canal or foraminal narrowing, no evidence of cord compression.  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, potential side effect explained, He was noted to have mild left shoulder elevation, mild retrocollis, moderate right turn, mild left tilt, significant atrophy of right upper cervical paraspinal muscles, tenderness,  UPDATE March 22 2020: Reported 80% improvement with his first EMG guided Xeomin injection in May 2020, he lost follow-up due to concern of pandemic, he noticed increased abnormal neck posturing, forceful turning towards the right side, also head titubation, want to resume Xeomin injection, there was no significant side effect from previous injectionI called the patient and scheduled his Xeomin injection for 04/21/20.  UPDATE April 24 2020: He did not  continue 3 months scheduled Xeomin injection for his cervical dystonia in 2020, despite significant improvement in May 2020, he has a lot of cervical dystonia related questions, we have discussed the treatment option, decided to continue injection now, we are also seeking second opinion, I have referred him to [facility] neuromuscular clinic The most bothersome symptoms from his cervical dystonia are frequent head titubation, which often noticed by his colleagues, he also complains of posterior neck muscle strain, stiffness,  UPDATE Jul 27 2020: I reviewed [facility] evaluation by Keith Faulkner on June 16, 2020, confirmed diagnosis of cervical dystonia, suggest continue Botox injection, not a candidate for deep brain stimulation yet, may try clonazepam or Artane as needed, physical therapy,  Reported 80% improvement to his previous injection, had much less head titubation following injection, no significant side effect noted  He has anxiety, complains of insomnia, he was given prescription of clonazepam 0.5 mg every night, which has helped him sleep,  Update November 02, 2020: We used Xeomin 200 units during previous injection, he responded well, but during the past 2 weeks, he noticed wearing off, increased head titubation, also tendency of turning his neck towards the right shoulder, he reported significant improvement with physical therapy, stretching exercise, no significant neck or shoulder muscle pain  His sleep has improved as well, no longer requires sleeping agent, has stopped taking trazodone  REVIEW OF SYSTEMS: Full 14 system review of systems performed and notable only for as above. All other review of systems were negative.  ALLERGIES: No Known Allergies  HOME MEDICATIONS: Current Outpatient Medications  Medication Sig Dispense Refill  . incobotulinumtoxinA (XEOMIN) 100 units SOLR injection Inject into the muscle every 3 (three) months.  No current  facility-administered medications for this visit.    PAST MEDICAL HISTORY: Past Medical History:  Diagnosis Date  . Degenerative disc disease, cervical   . Torticollis     PAST SURGICAL HISTORY: History reviewed. No pertinent surgical history.  FAMILY HISTORY: Family History  Problem Relation Age of Onset  . Ovarian cancer Mother   . Other Father        degenerative disc disease  . Colon cancer Neg Hx     SOCIAL HISTORY: Social History   Socioeconomic History  . Marital status: Married    Spouse name: Not on file  . Number of children: 2  . Years of education: college  . Highest education level: Not on file  Occupational History  . Not on file  Tobacco Use  . Smoking status: Never Smoker  . Smokeless tobacco: Never Used  Substance and Sexual Activity  . Alcohol use: Yes    Alcohol/week: 3.0 standard drinks    Types: 3 Glasses of wine per week    Comment: OCCASIONAL  . Drug use: No  . Sexual activity: Not on file  Other Topics Concern  . Not on file  Social History Narrative   Lives at home with his family.   Right-handed.   3-4 cups caffeine per day.   Social Determinants of Health   Financial Resource Strain: Not on file  Food Insecurity: Not on file  Transportation Needs: Not on file  Physical Activity: Not on file  Stress: Not on file  Social Connections: Not on file  Intimate Partner Violence: Not on file     PHYSICAL EXAM   Vitals:   11/02/20 1103  BP: 126/74  Pulse: 68  Weight: 209 lb 8 oz (95 kg)  Height: 5\' 10"  (1.778 m)   Not recorded     Body mass index is 30.06 kg/m.  PHYSICAL EXAMNIATION: He has mild left shoulder elevation, mild retrocollis, moderate to severe right turn, mild left tilt, significant  hypertrophy of right upper cervical paraspinal muscles, tenderness on palpitation, good range of motion, frequent no-no head titubation  ASSESSMENT AND PLAN  Keith Faulkner is a 50 y.o. male   Cervical dystonia:  He has  mild left shoulder elevation, mild retrocollis, mild right turn, mild left tilt, significant  hypertrophy of right upper cervical paraspinal muscles,   We used Xeomin 100 units (100 units dissolving to 1 cc normal saline)  Under EMG guidance  Left levator scapular 12.5 units Left sternocleidomastoid 12.5 units Right splenius capitis 12.5 Right longissimus capitis 12.5 units Right splenius cervix 25     54, M.D. Ph.D.  [facility] [address] [place], [place] Ph: [phone] Fax: [phone]  CC: Referring Provider

## 2020-11-02 NOTE — Progress Notes (Signed)
**  Xeomin 100 units x1 vial, NDC 0259-1610-01, Lot [id number], Exp 04/2022, office supply.//mck,rn** 

## 2021-01-31 ENCOUNTER — Telehealth: Payer: Self-pay | Admitting: Neurology

## 2021-01-31 NOTE — Telephone Encounter (Signed)
Patient has a Xeomin appointment 4/27. PA for Xeomin (PA #[id number]) expired 01/30/21. I called [employer] and was directed to [facility] [phone]) and spoke with Debarah Crape to get authorization for B6389 (G24.3). PA was approved. PA #[id number] (01/31/21- 01/31/22).

## 2021-02-01 ENCOUNTER — Ambulatory Visit: Payer: BC Managed Care – PPO | Admitting: Neurology

## 2021-02-01 ENCOUNTER — Other Ambulatory Visit: Payer: Self-pay

## 2021-02-01 ENCOUNTER — Encounter: Payer: Self-pay | Admitting: Neurology

## 2021-02-01 VITALS — BP 124/78 | HR 66 | Ht 70.0 in | Wt 207.5 lb

## 2021-02-01 DIAGNOSIS — G243 Spasmodic torticollis: Secondary | ICD-10-CM

## 2021-02-01 MED ORDER — INCOBOTULINUMTOXINA 100 UNITS IM SOLR
100.0000 [IU] | INTRAMUSCULAR | Status: DC
  Administered 2021-02-01: 100 [IU] via INTRAMUSCULAR

## 2021-02-01 NOTE — Progress Notes (Signed)
PATIENT: Keith Faulkner DOB: 1971/06/29  Chief Complaint  Patient presents with  . Procedure    Cervical Dystonia - Xeomin     HISTORICAL  Keith Faulkner is a 50 year old male, seen in request by Dr. Barnett Abu for evaluation of abnormal neck posturing  He was previously healthy, without clear triggers, he began to notice abnormal neck posturing since summer 2019, he has difficulty finding a comfortable position, later noticed constant neck pulling towards the left shoulder, leaning backwards, slow progressive, denies significant radiating pain to bilateral shoulder, or upper extremity, he denies gait abnormality  He was seen by chiropractor, with only minimal improvement, was later referred to have MRI of cervical spine [facility] in January 2020, C1-2, mild counterclockwise rotation of C1 relative to mild clockwise rotation of C2, with corresponding mild posterior positioning of the right lateral mass of C1 relative to right lateral mass of C2, mild multilevel degenerative changes, there was no significant canal or foraminal narrowing, no evidence of cord compression.  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, potential side effect explained, He was noted to have mild left shoulder elevation, mild retrocollis, moderate right turn, mild left tilt, significant atrophy of right upper cervical paraspinal muscles, tenderness,  UPDATE March 22 2020: Reported 80% improvement with his first EMG guided Xeomin injection in May 2020, he lost follow-up due to concern of pandemic, he noticed increased abnormal neck posturing, forceful turning towards the right side, also head titubation, want to resume Xeomin injection, there was no significant side effect from previous injectionI called the patient and scheduled his Xeomin injection for 04/21/20.  UPDATE April 24 2020: He did  not continue 3 months scheduled Xeomin injection for his cervical dystonia in 2020, despite significant improvement in May 2020, he has a lot of cervical dystonia related questions, we have discussed the treatment option, decided to continue injection now, we are also seeking second opinion, I have referred him to [facility] neuromuscular clinic The most bothersome symptoms from his cervical dystonia are frequent head titubation, which often noticed by his colleagues, he also complains of posterior neck muscle strain, stiffness,  UPDATE Jul 27 2020: I reviewed [facility] evaluation by Dr. Michaell Cowing on June 16, 2020, confirmed diagnosis of cervical dystonia, suggest continue Botox injection, not a candidate for deep brain stimulation yet, may try clonazepam or Artane as needed, physical therapy,  Reported 80% improvement to his previous injection, had much less head titubation following injection, no significant side effect noted  He has anxiety, complains of insomnia, he was given prescription of clonazepam 0.5 mg every night, which has helped him sleep,  Update November 02, 2020: We used Xeomin 200 units during previous injection, he responded well, but during the past 2 weeks, he noticed wearing off, increased head titubation, also tendency of turning his neck towards the right shoulder, he reported significant improvement with physical therapy, stretching exercise, no significant neck or shoulder muscle pain  His sleep has improved as well, no longer requires sleeping agent, has stopped taking trazodone  UPDATE February 01 2021: He responded very well to previous injection no significant side effect noted  REVIEW OF SYSTEMS: Full 14 system review of systems performed and notable only for as above. All other review of systems were negative.  ALLERGIES: No Known Allergies  HOME MEDICATIONS: Current Outpatient Medications  Medication Sig Dispense  Refill  . incobotulinumtoxinA  (XEOMIN) 100 units SOLR injection Inject into the muscle every 3 (three) months.     No current facility-administered medications for this visit.    PAST MEDICAL HISTORY: Past Medical History:  Diagnosis Date  . Degenerative disc disease, cervical   . Torticollis     PAST SURGICAL HISTORY: History reviewed. No pertinent surgical history.  FAMILY HISTORY: Family History  Problem Relation Age of Onset  . Ovarian cancer Mother   . Other Father        degenerative disc disease  . Colon cancer Neg Hx     SOCIAL HISTORY: Social History   Socioeconomic History  . Marital status: Married    Spouse name: Not on file  . Number of children: 2  . Years of education: college  . Highest education level: Not on file  Occupational History  . Not on file  Tobacco Use  . Smoking status: Never Smoker  . Smokeless tobacco: Never Used  Substance and Sexual Activity  . Alcohol use: Yes    Alcohol/week: 3.0 standard drinks    Types: 3 Glasses of wine per week    Comment: OCCASIONAL  . Drug use: No  . Sexual activity: Not on file  Other Topics Concern  . Not on file  Social History Narrative   Lives at home with his family.   Right-handed.   3-4 cups caffeine per day.   Social Determinants of Health   Financial Resource Strain: Not on file  Food Insecurity: Not on file  Transportation Needs: Not on file  Physical Activity: Not on file  Stress: Not on file  Social Connections: Not on file  Intimate Partner Violence: Not on file     PHYSICAL EXAM   Vitals:   02/01/21 1259  BP: 124/78  Pulse: 66  Weight: 207 lb 8 oz (94.1 kg)  Height: 5\' 10"  (1.778 m)   Not recorded     Body mass index is 29.77 kg/m.  PHYSICAL EXAMNIATION: He has mild left shoulder elevation, mild retrocollis, moderate to severe right turn, mild left tilt, significant  hypertrophy of right upper cervical paraspinal muscles, tenderness on palpitation, good range of motion, frequent no-no head  titubation  ASSESSMENT AND PLAN  Keith Faulkner is a 50 y.o. male   Cervical dystonia:  He has mild left shoulder elevation, mild retrocollis, mild right turn, mild left tilt, significant hypertrophy of right upper cervical paraspinal muscles,   We used Xeomin 100 units (100 units dissolving to 1 cc normal saline)  Under EMG guidance  Left levator scapular 12.5 units Left sternocleidomastoid 12.5 units Right splenius capitis 12.5 Right longissimus capitis 12.5 units Right splenius cervix 25    44, M.D. Ph.D.  [facility] [address] [place], [place] Ph: [phone] Fax: [phone]  CC: Referring Provider

## 2021-02-01 NOTE — Progress Notes (Signed)
**  Xeomin 100 untis x 1 vial, NDC 0259-1610-01, Lot [id number], Exp 09/2022, office supply.//mck,rn**

## 2021-05-03 ENCOUNTER — Encounter: Payer: Self-pay | Admitting: Neurology

## 2021-05-03 ENCOUNTER — Ambulatory Visit: Payer: BC Managed Care – PPO | Admitting: Neurology

## 2021-05-03 VITALS — BP 118/76 | HR 62 | Ht 70.0 in | Wt 175.5 lb

## 2021-05-03 DIAGNOSIS — G243 Spasmodic torticollis: Secondary | ICD-10-CM

## 2021-05-03 MED ORDER — INCOBOTULINUMTOXINA 100 UNITS IM SOLR
100.0000 [IU] | INTRAMUSCULAR | Status: DC
  Administered 2021-05-03: 100 [IU] via INTRAMUSCULAR

## 2021-05-03 NOTE — Progress Notes (Signed)
PATIENT: Keith Faulkner DOB: Oct 14, 1970  Chief Complaint  Patient presents with   Procedure    Xeomin    HISTORICAL  Keith Faulkner is a 50 year old male, seen in request by Dr. Barnett Abu for evaluation of abnormal neck posturing  He was previously healthy, without clear triggers, he began to notice abnormal neck posturing since summer 2019, he has difficulty finding a comfortable position, later noticed constant neck pulling towards the left shoulder, leaning backwards, slow progressive, denies significant radiating pain to bilateral shoulder, or upper extremity, he denies gait abnormality  He was seen by chiropractor, with only minimal improvement, was later referred to have MRI of cervical spine [facility] in January 2020, C1-2, mild counterclockwise rotation of C1 relative to mild clockwise rotation of C2, with corresponding mild posterior positioning of the right lateral mass of C1 relative to right lateral mass of C2, mild multilevel degenerative changes, there was no significant canal or foraminal narrowing, no evidence of cord compression.  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, potential side effect explained, He was noted to have mild left shoulder elevation, mild retrocollis, moderate right turn, mild left tilt, significant atrophy of right upper cervical paraspinal muscles, tenderness,  UPDATE March 22 2020: Reported 80% improvement with his first EMG guided Xeomin injection in May 2020, he lost follow-up due to concern of pandemic, he noticed increased abnormal neck posturing, forceful turning towards the right side, also head titubation, want to resume Xeomin injection, there was no significant side effect from previous injectionI called the patient and scheduled his Xeomin injection for 04/21/20.  UPDATE April 24 2020: He did not continue 3 months  scheduled Xeomin injection for his cervical dystonia in 2020, despite significant improvement in May 2020, he has a lot of cervical dystonia related questions, we have discussed the treatment option, decided to continue injection now, we are also seeking second opinion, I have referred him to [facility] neuromuscular clinic The most bothersome symptoms from his cervical dystonia are frequent head titubation, which often noticed by his colleagues, he also complains of posterior neck muscle strain, stiffness,  UPDATE Jul 27 2020: I reviewed [facility] evaluation by Dr. Michaell Cowing on June 16, 2020, confirmed diagnosis of cervical dystonia, suggest continue Botox injection, not a candidate for deep brain stimulation yet, may try clonazepam or Artane as needed, physical therapy,  Reported 80% improvement to his previous injection, had much less head titubation following injection, no significant side effect noted  He has anxiety, complains of insomnia, he was given prescription of clonazepam 0.5 mg every night, which has helped him sleep,  Update November 02, 2020: We used Xeomin 200 units during previous injection, he responded well, but during the past 2 weeks, he noticed wearing off, increased head titubation, also tendency of turning his neck towards the right shoulder, he reported significant improvement with physical therapy, stretching exercise, no significant neck or shoulder muscle pain  His sleep has improved as well, no longer requires sleeping agent, has stopped taking trazodone  UPDATE February 01 2021: He responded very well to previous injection no significant side effect noted  Update May 03, 2021: He responded well to previous injection, the most bothersome symptoms are frequent head titubation, and feeling his neck turning towards the right side, there was no significant pain  REVIEW OF SYSTEMS: Full 14 system review of systems  performed and notable only for as  above. All other review of systems were negative.  ALLERGIES: No Known Allergies  HOME MEDICATIONS: Current Outpatient Medications  Medication Sig Dispense Refill   incobotulinumtoxinA (XEOMIN) 100 units SOLR injection Inject into the muscle every 3 (three) months.     No current facility-administered medications for this visit.    PAST MEDICAL HISTORY: Past Medical History:  Diagnosis Date   Degenerative disc disease, cervical    Torticollis     PAST SURGICAL HISTORY: History reviewed. No pertinent surgical history.  FAMILY HISTORY: Family History  Problem Relation Age of Onset   Ovarian cancer Mother    Other Father        degenerative disc disease   Colon cancer Neg Hx     SOCIAL HISTORY: Social History   Socioeconomic History   Marital status: Married    Spouse name: Not on file   Number of children: 2   Years of education: college   Highest education level: Not on file  Occupational History   Not on file  Tobacco Use   Smoking status: Never   Smokeless tobacco: Never  Substance and Sexual Activity   Alcohol use: Yes    Alcohol/week: 3.0 standard drinks    Types: 3 Glasses of wine per week    Comment: OCCASIONAL   Drug use: No   Sexual activity: Not on file  Other Topics Concern   Not on file  Social History Narrative   Lives at home with his family.   Right-handed.   3-4 cups caffeine per day.   Social Determinants of Health   Financial Resource Strain: Not on file  Food Insecurity: Not on file  Transportation Needs: Not on file  Physical Activity: Not on file  Stress: Not on file  Social Connections: Not on file  Intimate Partner Violence: Not on file     PHYSICAL EXAM   Vitals:   05/03/21 1112  BP: 118/76  Pulse: 62  Weight: 175 lb 8 oz (79.6 kg)  Height: 5\' 10"  (1.778 m)   Not recorded     Body mass index is 25.18 kg/m.  PHYSICAL EXAMNIATION: He has mild left shoulder elevation, mild retrocollis, moderate  right turn,  mild left tilt, significant  hypertrophy of right upper cervical paraspinal muscles,  good range of motion, frequent no-no head titubation  ASSESSMENT AND PLAN  Keith Faulkner is a 50 y.o. male   Cervical dystonia:    He has mild left shoulder elevation, mild retrocollis, moderate  right turn, mild left tilt, significant  hypertrophy of right upper cervical paraspinal muscles,  good range of motion, frequent no-no head titubation  We used Xeomin 100 units (100 units dissolving to 1 cc normal saline)  Under EMG guidance  Left levator scapular 12.5 units Left sternocleidomastoid 12.5 units Right splenius capitis 25 Right longissimus capitis 25 units Right splenius cervix 25     44, M.D. Ph.D.  [facility] [address] [place], [place] Ph: [phone] Fax: [phone]  CC: Referring Provider

## 2021-05-03 NOTE — Progress Notes (Signed)
**  Xeomin 100 units x 1 vial, NDC 0259-1610-01, Lot [id number], Exp 12/2022, office supply.//mck,rn** 

## 2021-08-09 ENCOUNTER — Other Ambulatory Visit: Payer: Self-pay

## 2021-08-09 ENCOUNTER — Ambulatory Visit: Payer: BC Managed Care – PPO | Admitting: Neurology

## 2021-08-09 ENCOUNTER — Encounter: Payer: Self-pay | Admitting: Neurology

## 2021-08-09 VITALS — BP 114/78 | HR 75 | Ht 71.0 in | Wt 158.0 lb

## 2021-08-09 DIAGNOSIS — G243 Spasmodic torticollis: Secondary | ICD-10-CM | POA: Diagnosis not present

## 2021-08-09 MED ORDER — INCOBOTULINUMTOXINA 100 UNITS IM SOLR
100.0000 [IU] | INTRAMUSCULAR | Status: DC
  Administered 2021-08-09: 100 [IU] via INTRAMUSCULAR

## 2021-08-09 NOTE — Progress Notes (Signed)
PATIENT: Keith Faulkner DOB: Nov 05, 1970  Chief Complaint  Patient presents with   Procedure    xeomin    HISTORICAL  Keith Faulkner is a 50 year old male, seen in request by Dr. Kristeen Miss for evaluation of abnormal neck posturing  He was previously healthy, without clear triggers, he began to notice abnormal neck posturing since summer 2019, he has difficulty finding a comfortable position, later noticed constant neck pulling towards the left shoulder, leaning backwards, slow progressive, denies significant radiating pain to bilateral shoulder, or upper extremity, he denies gait abnormality  He was seen by chiropractor, with only minimal improvement, was later referred to have MRI of cervical spine [facility] in January 2020, C1-2, mild counterclockwise rotation of C1 relative to mild clockwise rotation of C2, with corresponding mild posterior positioning of the right lateral mass of C1 relative to right lateral mass of C2, mild multilevel degenerative changes, there was no significant canal or foraminal narrowing, no evidence of cord compression.  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, potential side effect explained, He was noted to have mild left shoulder elevation, mild retrocollis, moderate right turn, mild left tilt, significant atrophy of right upper cervical paraspinal muscles, tenderness,  UPDATE March 22 2020: Reported 80% improvement with his first EMG guided Xeomin injection in May 2020, he lost follow-up due to concern of pandemic, he noticed increased abnormal neck posturing, forceful turning towards the right side, also head titubation, want to resume Xeomin injection, there was no significant side effect from previous injectionI called the patient and scheduled his Xeomin injection for 04/21/20.  UPDATE April 24 2020: He did not continue 3 months  scheduled Xeomin injection for his cervical dystonia in 2020, despite significant improvement in May 2020, he has a lot of cervical dystonia related questions, we have discussed the treatment option, decided to continue injection now, we are also seeking second opinion, I have referred him to [facility] neuromuscular clinic The most bothersome symptoms from his cervical dystonia are frequent head titubation, which often noticed by his colleagues, he also complains of posterior neck muscle strain, stiffness,  UPDATE Jul 27 2020: I reviewed [facility] evaluation by Dr. Raoul Pitch on June 16, 2020, confirmed diagnosis of cervical dystonia, suggest continue Botox injection, not a candidate for deep brain stimulation yet, may try clonazepam or Artane as needed, physical therapy,  Reported 80% improvement to his previous injection, had much less head titubation following injection, no significant side effect noted  He has anxiety, complains of insomnia, he was given prescription of clonazepam 0.5 mg every night, which has helped him sleep,  Update November 02, 2020: We used Xeomin 200 units during previous injection, he responded well, but during the past 2 weeks, he noticed wearing off, increased head titubation, also tendency of turning his neck towards the right shoulder, he reported significant improvement with physical therapy, stretching exercise, no significant neck or shoulder muscle pain  His sleep has improved as well, no longer requires sleeping agent, has stopped taking trazodone  UPDATE February 01 2021: He responded very well to previous injection no significant side effect noted  Update May 03, 2021: He responded well to previous injection, the most bothersome symptoms are frequent head titubation, and feeling his neck turning towards the right side, there was no significant pain  Update August 09, 2021: He responded well to previous  injection, no significant neck pain, is  bothered by his frequent no-no head shaking, neck turning towards the right side  REVIEW OF SYSTEMS: Full 14 system review of systems performed and notable only for as above. All other review of systems were negative.  ALLERGIES: No Known Allergies  HOME MEDICATIONS: Current Outpatient Medications  Medication Sig Dispense Refill   incobotulinumtoxinA (XEOMIN) 100 units SOLR injection Inject into the muscle every 3 (three) months.     Current Facility-Administered Medications  Medication Dose Route Frequency Provider Last Rate Last Admin   incobotulinumtoxinA (XEOMIN) 100 units injection 100 Units  100 Units Intramuscular Q90 days Levert Feinstein, MD   100 Units at 05/03/21 1147    PAST MEDICAL HISTORY: Past Medical History:  Diagnosis Date   Degenerative disc disease, cervical    Torticollis     PAST SURGICAL HISTORY: History reviewed. No pertinent surgical history.  FAMILY HISTORY: Family History  Problem Relation Age of Onset   Ovarian cancer Mother    Other Father        degenerative disc disease   Colon cancer Neg Hx     SOCIAL HISTORY: Social History   Socioeconomic History   Marital status: Married    Spouse name: Not on file   Number of children: 2   Years of education: college   Highest education level: Not on file  Occupational History   Not on file  Tobacco Use   Smoking status: Never   Smokeless tobacco: Never  Substance and Sexual Activity   Alcohol use: Yes    Alcohol/week: 3.0 standard drinks    Types: 3 Glasses of wine per week    Comment: OCCASIONAL   Drug use: No   Sexual activity: Not on file  Other Topics Concern   Not on file  Social History Narrative   Lives at home with his family.   Right-handed.   3-4 cups caffeine per day.   Social Determinants of Health   Financial Resource Strain: Not on file  Food Insecurity: Not on file  Transportation Needs: Not on file  Physical Activity: Not on file  Stress: Not on file  Social  Connections: Not on file  Intimate Partner Violence: Not on file     PHYSICAL EXAM   Vitals:   08/09/21 1057  BP: 114/78  Pulse: 75  Weight: 158 lb (71.7 kg)  Height: 5\' 11"  (1.803 m)   Not recorded     Body mass index is 22.04 kg/m.  PHYSICAL EXAMNIATION: He has mild left shoulder elevation, mild retrocollis, moderate  right turn, mild left tilt, significant  hypertrophy of right upper cervical paraspinal muscles,  good range of motion, frequent no-no head titubation  ASSESSMENT AND PLAN  Keith Faulkner is a 50 y.o. male   Cervical dystonia:    He has mild left shoulder elevation, mild retrocollis, moderate  right turn, mild left tilt, significant  hypertrophy of right upper cervical paraspinal muscles,  good range of motion, only occasionally no-no head titubation  We used Xeomin 100 units (100 units dissolving to 1 cc normal saline)  Under EMG guidance  Left levator scapular 12.5 units Right splenius capitis 25 Right inferior oblique capitus 12.5 units Right longissimus capitis 25 units Right splenius cervix 12.5 Left inferior oblique capitis 12.5     44, M.D. Ph.D.  [facility] [address] [place], [place] Ph: [phone] Fax: [phone]  CC: Referring Provider

## 2021-08-09 NOTE — Progress Notes (Signed)
Xeomin 100 units x 1 vail B/B [id number] NDC-0259-1610-01 EXP-06-2023

## 2021-11-09 ENCOUNTER — Ambulatory Visit: Payer: BC Managed Care – PPO | Admitting: Neurology

## 2021-11-09 ENCOUNTER — Encounter: Payer: Self-pay | Admitting: Neurology

## 2021-11-09 VITALS — BP 125/74 | HR 84 | Ht 71.0 in | Wt 158.0 lb

## 2021-11-09 DIAGNOSIS — G243 Spasmodic torticollis: Secondary | ICD-10-CM | POA: Diagnosis not present

## 2021-11-09 MED ORDER — INCOBOTULINUMTOXINA 100 UNITS IM SOLR
100.0000 [IU] | INTRAMUSCULAR | Status: DC
  Administered 2021-11-09: 100 [IU] via INTRAMUSCULAR

## 2021-11-09 NOTE — Progress Notes (Signed)
PATIENT: Keith Faulkner DOB: 1971/05/19  No chief complaint on file.   HISTORICAL  Keith Faulkner is a 51 year old male, seen in request by Dr. Kristeen Miss for evaluation of abnormal neck posturing  He was previously healthy, without clear triggers, he began to notice abnormal neck posturing since summer 2019, he has difficulty finding a comfortable position, later noticed constant neck pulling towards the left shoulder, leaning backwards, slow progressive, denies significant radiating pain to bilateral shoulder, or upper extremity, he denies gait abnormality  He was seen by chiropractor, with only minimal improvement, was later referred to have MRI of cervical spine [facility] in January 2020, C1-2, mild counterclockwise rotation of C1 relative to mild clockwise rotation of C2, with corresponding mild posterior positioning of the right lateral mass of C1 relative to right lateral mass of C2, mild multilevel degenerative changes, there was no significant canal or foraminal narrowing, no evidence of cord compression.  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, potential side effect explained, He was noted to have mild left shoulder elevation, mild retrocollis, moderate right turn, mild left tilt, significant atrophy of right upper cervical paraspinal muscles, tenderness,  UPDATE March 22 2020: Reported 80% improvement with his first EMG guided Xeomin injection in May 2020, he lost follow-up due to concern of pandemic, he noticed increased abnormal neck posturing, forceful turning towards the right side, also head titubation, want to resume Xeomin injection, there was no significant side effect from previous injectionI called the patient and scheduled his Xeomin injection for 04/21/20.  UPDATE April 24 2020: He did not continue 3 months scheduled Xeomin injection for his  cervical dystonia in 2020, despite significant improvement in May 2020, he has a lot of cervical dystonia related questions, we have discussed the treatment option, decided to continue injection now, we are also seeking second opinion, I have referred him to [facility] neuromuscular clinic The most bothersome symptoms from his cervical dystonia are frequent head titubation, which often noticed by his colleagues, he also complains of posterior neck muscle strain, stiffness,  UPDATE Jul 27 2020: I reviewed [facility] evaluation by Dr. Raoul Pitch on June 16, 2020, confirmed diagnosis of cervical dystonia, suggest continue Botox injection, not a candidate for deep brain stimulation yet, may try clonazepam or Artane as needed, physical therapy,  Reported 80% improvement to his previous injection, had much less head titubation following injection, no significant side effect noted  He has anxiety, complains of insomnia, he was given prescription of clonazepam 0.5 mg every night, which has helped him sleep,  Update November 02, 2020: We used Xeomin 200 units during previous injection, he responded well, but during the past 2 weeks, he noticed wearing off, increased head titubation, also tendency of turning his neck towards the right shoulder, he reported significant improvement with physical therapy, stretching exercise, no significant neck or shoulder muscle pain  His sleep has improved as well, no longer requires sleeping agent, has stopped taking trazodone  UPDATE February 01 2021: He responded very well to previous injection no significant side effect noted  Update May 03, 2021: He responded well to previous injection, the most bothersome symptoms are frequent head titubation, and feeling his neck turning towards the right side, there was no significant pain  Update August 09, 2021: He responded well to previous injection, no significant neck pain, is bothered by his  frequent no-no head  shaking, neck turning towards the right side  UPDATE Nov 09 2021: He responded very well to previous injection, no significant side effect noted.    PHYSICAL EXAM   Vitals:   11/09/21 1307  BP: 125/74  Pulse: 84  Weight: 158 lb (71.7 kg)  Height: 5\' 11"  (1.803 m)   Not recorded     Body mass index is 22.04 kg/m.  PHYSICAL EXAMNIATION: He has mild left shoulder elevation, mild retrocollis, mild right turn, mild left tilt,  good range of motion, frequent no-no head titubation  ASSESSMENT AND PLAN  Keith Faulkner is a 51 y.o. male   Cervical dystonia:    He has mild left shoulder elevation, mild retrocollis, mild right turn, mild left tilt,  ,  good range of motion, frequent no-no head titubation  We used Xeomin 100 units (100 units dissolving to 1 cc normal saline)  Under EMG guidance  Left levator scapular 12.5 units Left splenius capitis 25 Left inferior oblique capitus 12.5 units Left longissimus capitis 25 units Right splenius capitis 25      Marcial Pacas, M.D. Ph.D.  [facility] [address] [place], [place] [address] Ph: [phone] Fax: [phone]  CC: Referring Provider

## 2021-11-09 NOTE — Progress Notes (Signed)
Xeomin 100 units  Ndc-0259-1610-01 Exp-09/2023 [id number] B/B

## 2022-02-01 ENCOUNTER — Telehealth: Payer: Self-pay | Admitting: Neurology

## 2022-02-01 NOTE — Telephone Encounter (Signed)
Completed Xeomin PA form for [employer], placed in Nurse pod for MD signature. ?

## 2022-02-05 NOTE — Telephone Encounter (Signed)
Faxed PA with OV notes to [employer]. ?

## 2022-02-06 NOTE — Telephone Encounter (Signed)
Received PA from [place], [place] # [id number] (02/06/2022-02/07/2023). ?

## 2022-02-08 ENCOUNTER — Encounter: Payer: Self-pay | Admitting: Neurology

## 2022-02-08 ENCOUNTER — Ambulatory Visit: Payer: BC Managed Care – PPO | Admitting: Neurology

## 2022-02-08 VITALS — BP 117/74 | HR 74 | Ht 71.0 in | Wt 158.0 lb

## 2022-02-08 DIAGNOSIS — G243 Spasmodic torticollis: Secondary | ICD-10-CM

## 2022-02-08 MED ORDER — INCOBOTULINUMTOXINA 100 UNITS IM SOLR
100.0000 [IU] | INTRAMUSCULAR | Status: DC
  Administered 2022-02-08: 100 [IU] via INTRAMUSCULAR

## 2022-02-08 NOTE — Progress Notes (Signed)
Xeomin 100 units x 1 vial  ?[phone] ?Exp-03/2024 ?Ndc-0259-1910-01 ?B/B ?

## 2022-02-08 NOTE — Progress Notes (Signed)
?  ? ?PATIENT: Keith Faulkner ?DOB: 1971/07/26 ? ?Chief Complaint  ?Patient presents with  ? Procedure  ?  Room 14 ?Xeomin   ?  ?HISTORICAL ? ?Keith Faulkner is a 51 year old male, seen in request by Dr. Kristeen Miss for evaluation of abnormal neck posturing ? ?He was previously healthy, without clear triggers, he began to notice abnormal neck posturing since summer 2019, he has difficulty finding a comfortable position, later noticed constant neck pulling towards the left shoulder, leaning backwards, slow progressive, denies significant radiating pain to bilateral shoulder, or upper extremity, he denies gait abnormality ? ?He was seen by chiropractor, with only minimal improvement, was later referred to have MRI of cervical spine [facility] in January 2020, C1-2, mild counterclockwise rotation of C1 relative to mild clockwise rotation of C2, with corresponding mild posterior positioning of the right lateral mass of C1 relative to right lateral mass of C2, mild multilevel degenerative changes, there was no significant canal or foraminal narrowing, no evidence of cord compression. ? ?He also went to a round of physical therapy, with mild improvement, ? 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 ?UPDATE March 22 2020: ?Reported 80% improvement with his first EMG guided Xeomin injection in May 2020, he lost follow-up due to concern of pandemic, he noticed increased abnormal neck posturing, forceful turning towards the right side, also head titubation, want to resume Xeomin injection, there was no significant side effect from previous injectionI called the patient and scheduled his Xeomin injection for 04/21/20. ? ?UPDATE April 24 2020: ?He did not continue 3  months scheduled Xeomin injection for his cervical dystonia in 2020, despite significant improvement in May 2020, he has a lot of cervical dystonia related questions, we have discussed the treatment option, decided to continue injection now, we are also seeking second opinion, I have referred him to [facility] neuromuscular clinic ?The most bothersome symptoms from his cervical dystonia are frequent head titubation, which often noticed by his colleagues, he also complains of posterior neck muscle strain, stiffness, ? ?UPDATE Jul 27 2020: ?I reviewed [facility] evaluation by Dr. Raoul Pitch on June 16, 2020, confirmed diagnosis of cervical dystonia, suggest continue Botox injection, not a candidate for deep brain stimulation yet, may try clonazepam or Artane as needed, physical therapy, ? ?Reported 80% improvement to his previous injection, had much less head titubation following injection, no significant side effect noted ? ?He has anxiety, complains of insomnia, he was given prescription of clonazepam 0.5 mg every night, which has helped him sleep, ? ?Update November 02, 2020: ?We used Xeomin 200 units during previous injection, he responded well, but during the past 2 weeks, he noticed wearing off, increased head titubation, also tendency of turning his neck towards the right shoulder, he reported significant improvement with physical therapy, stretching exercise, no significant neck or shoulder muscle pain ? ?His sleep has improved as well, no longer requires sleeping agent, has stopped taking trazodone ? ?UPDATE February 01 2021: ?He responded very well to previous injection no significant side effect noted ? ?Update May 03, 2021: ?He responded well to previous injection, the most bothersome symptoms are frequent head titubation, and feeling his neck turning towards the right side, there was no significant pain ? ?Update August 09, 2021: ?He responded  well to previous injection, no significant neck  pain, is bothered by his frequent no-no head shaking, neck turning towards the right side ? ?UPDATE Nov 09 2021: ?He responded very well to previous injection, no significant side effect noted. ? ?UPDate Feb 08, 2022, ?He responded to 100 units xeomin injection well, denies significant pain, the most bothersome symptoms is intermittent head tremor, wants to continue injection ? ?PHYSICAL EXAM ?  ?Vitals:  ? 02/08/22 1331  ?BP: 117/74  ?Pulse: 74  ?Weight: 158 lb (71.7 kg)  ?Height: 5\' 11"  (1.803 m)  ? ?Not recorded ?  ? ? ?Body mass index is 22.04 kg/m?. ? ?PHYSICAL EXAMNIATION: ?He has mild left shoulder elevation, mild retrocollis, mild right turn,  good range of motion, occasional no-no head titubation ? ?ASSESSMENT AND PLAN ? ?Keith Faulkner is a 51 y.o. male   ?Cervical dystonia:  ? ?We used Xeomin 100 units (100 units is dissolved into 2 cc of normal saline ) ? ?under EMG guidance ? ?Left levator scapular 25 units ?Right splenius capitis 25 ?Right splenius cervix 25 units   ?Right longissimus capitis 25 units ? ? ?  ? ?Marcial Pacas, M.D. Ph.D. ? ?[facility] ?[address], [address] ?[place], [place] [address] ?Ph: [phone]) [phone] ?Fax: [phone] ? ?CC: Referring Provider ? ?

## 2022-05-14 NOTE — Telephone Encounter (Signed)
I called patient lm to get appt rescheduled.

## 2022-05-17 ENCOUNTER — Ambulatory Visit: Payer: BC Managed Care – PPO | Admitting: Neurology

## 2022-05-22 ENCOUNTER — Encounter: Payer: Self-pay | Admitting: Neurology

## 2022-05-22 ENCOUNTER — Ambulatory Visit: Payer: BC Managed Care – PPO | Admitting: Neurology

## 2022-05-22 VITALS — BP 106/74 | HR 76 | Ht 71.0 in | Wt 175.5 lb

## 2022-05-22 DIAGNOSIS — G243 Spasmodic torticollis: Secondary | ICD-10-CM | POA: Diagnosis not present

## 2022-05-22 MED ORDER — INCOBOTULINUMTOXINA 100 UNITS IM SOLR
100.0000 [IU] | INTRAMUSCULAR | Status: DC
  Administered 2022-05-22: 100 [IU] via INTRAMUSCULAR

## 2022-05-22 NOTE — Progress Notes (Signed)
PATIENT: Keith Faulkner DOB: 20-Dec-1970  Chief Complaint  Patient presents with   Procedure    Xeomin    HISTORICAL  Keith Faulkner is a 51 year old male, seen in request by Dr. Barnett Abu for evaluation of abnormal neck posturing  He was previously healthy, without clear triggers, he began to notice abnormal neck posturing since summer 2019, he has difficulty finding a comfortable position, later noticed constant neck pulling towards the left shoulder, leaning backwards, slow progressive, denies significant radiating pain to bilateral shoulder, or upper extremity, he denies gait abnormality  He was seen by chiropractor, with only minimal improvement, was later referred to have MRI of cervical spine [facility] in January 2020, C1-2, mild counterclockwise rotation of C1 relative to mild clockwise rotation of C2, with corresponding mild posterior positioning of the right lateral mass of C1 relative to right lateral mass of C2, mild multilevel degenerative changes, there was no significant canal or foraminal narrowing, no evidence of cord compression.  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, potential side effect explained, He was noted to have mild left shoulder elevation, mild retrocollis, moderate right turn, mild left tilt, significant atrophy of right upper cervical paraspinal muscles, tenderness,  UPDATE March 22 2020: Reported 80% improvement with his first EMG guided Xeomin injection in May 2020, he lost follow-up due to concern of pandemic, he noticed increased abnormal neck posturing, forceful turning towards the right side, also head titubation, want to resume Xeomin injection, there was no significant side effect from previous injectionI called the patient and scheduled his Xeomin injection for 04/21/20.  UPDATE April 24 2020: He did not continue 3 months  scheduled Xeomin injection for his cervical dystonia in 2020, despite significant improvement in May 2020, he has a lot of cervical dystonia related questions, we have discussed the treatment option, decided to continue injection now, we are also seeking second opinion, I have referred him to [facility] neuromuscular clinic The most bothersome symptoms from his cervical dystonia are frequent head titubation, which often noticed by his colleagues, he also complains of posterior neck muscle strain, stiffness,  UPDATE Jul 27 2020: I reviewed [facility] evaluation by Dr. Michaell Cowing on June 16, 2020, confirmed diagnosis of cervical dystonia, suggest continue Botox injection, not a candidate for deep brain stimulation yet, may try clonazepam or Artane as needed, physical therapy,  Reported 80% improvement to his previous injection, had much less head titubation following injection, no significant side effect noted  He has anxiety, complains of insomnia, he was given prescription of clonazepam 0.5 mg every night, which has helped him sleep,  Update November 02, 2020: We used Xeomin 200 units during previous injection, he responded well, but during the past 2 weeks, he noticed wearing off, increased head titubation, also tendency of turning his neck towards the right shoulder, he reported significant improvement with physical therapy, stretching exercise, no significant neck or shoulder muscle pain  His sleep has improved as well, no longer requires sleeping agent, has stopped taking trazodone  UPDATE February 01 2021: He responded very well to previous injection no significant side effect noted  Update May 03, 2021: He responded well to previous injection, the most bothersome symptoms are frequent head titubation, and feeling his neck turning towards the right side, there was no significant pain  Update August 09, 2021: He responded well to previous  injection, no significant neck pain, is  bothered by his frequent no-no head shaking, neck turning towards the right side  UPDATE Nov 09 2021: He responded very well to previous injection, no significant side effect noted.  UPDate Feb 08, 2022, He responded to 100 units xeomin injection well, denies significant pain, the most bothersome symptoms is intermittent head tremor, wants to continue injection  Update May 22, 2022: Previous injection did help him, no significant side effect, no significant neck pain    PHYSICAL EXAM   Vitals:   05/22/22 1629  BP: 106/74  Pulse: 76  Weight: 175 lb 8 oz (79.6 kg)  Height: 5\' 11"  (1.803 m)   Not recorded     Body mass index is 24.48 kg/m.  PHYSICAL EXAMNIATION: He has mild left shoulder elevation, mild retrocollis, mild right turn,  good range of motion, occasional no-no head titubation  ASSESSMENT AND PLAN  Keith Faulkner is a 51 y.o. male   Cervical dystonia:   We used Xeomin 100 units (100 units is dissolved into 2 cc of normal saline )  under EMG guidance  Left levator scapular 25 units Right splenius capitis 25 Right splenius cervix 25 units   Right longissimus capitis 12.5 units Left posterior scalene 12.5 units      44, M.D. Ph.D.  [facility] [address] [place], [place] Ph: [phone] Fax: [phone]  CC: Referring Provider

## 2022-05-22 NOTE — Progress Notes (Signed)
**  Xeomin 100 units x 1 vial, NDC 9741-6384-53, Lot [id number], Exp 05/2024, office supply.//mck,rn**

## 2022-08-22 ENCOUNTER — Ambulatory Visit: Payer: BC Managed Care – PPO | Admitting: Neurology

## 2022-08-28 ENCOUNTER — Encounter: Payer: Self-pay | Admitting: Neurology

## 2022-08-28 ENCOUNTER — Ambulatory Visit: Payer: BC Managed Care – PPO | Admitting: Neurology

## 2022-08-28 VITALS — BP 113/75 | HR 63 | Ht 71.0 in | Wt 175.0 lb

## 2022-08-28 DIAGNOSIS — G243 Spasmodic torticollis: Secondary | ICD-10-CM | POA: Diagnosis not present

## 2022-08-28 MED ORDER — INCOBOTULINUMTOXINA 100 UNITS IM SOLR
100.0000 [IU] | INTRAMUSCULAR | Status: DC
  Administered 2022-08-28: 100 [IU] via INTRAMUSCULAR

## 2022-08-28 NOTE — Progress Notes (Signed)
Xeomin 100 units x 1 vial  LOT #: [id number] EXP: 2025-09 NDC: 0737-1062-69  B/B

## 2022-08-28 NOTE — Progress Notes (Signed)
PATIENT: Keith Faulkner DOB: July 29, 1971  Chief Complaint  Patient presents with   Procedure    Rm 14.     HISTORICAL  Keith Faulkner is a 51 year old male, seen in request by Dr. Barnett Abu for evaluation of abnormal neck posturing  He was previously healthy, without clear triggers, he began to notice abnormal neck posturing since summer 2019, he has difficulty finding a comfortable position, later noticed constant neck pulling towards the left shoulder, leaning backwards, slow progressive, denies significant radiating pain to bilateral shoulder, or upper extremity, he denies gait abnormality  He was seen by chiropractor, with only minimal improvement, was later referred to have MRI of cervical spine [facility] in January 2020, C1-2, mild counterclockwise rotation of C1 relative to mild clockwise rotation of C2, with corresponding mild posterior positioning of the right lateral mass of C1 relative to right lateral mass of C2, mild multilevel degenerative changes, there was no significant canal or foraminal narrowing, no evidence of cord compression.  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, potential side effect explained, He was noted to have mild left shoulder elevation, mild retrocollis, moderate right turn, mild left tilt, significant atrophy of right upper cervical paraspinal muscles, tenderness,  UPDATE March 22 2020: Reported 80% improvement with his first EMG guided Xeomin injection in May 2020, he lost follow-up due to concern of pandemic, he noticed increased abnormal neck posturing, forceful turning towards the right side, also head titubation, want to resume Xeomin injection, there was no significant side effect from previous injectionI called the patient and scheduled his Xeomin injection for 04/21/20.  UPDATE April 24 2020: He did not continue 3 months  scheduled Xeomin injection for his cervical dystonia in 2020, despite significant improvement in May 2020, he has a lot of cervical dystonia related questions, we have discussed the treatment option, decided to continue injection now, we are also seeking second opinion, I have referred him to [facility] neuromuscular clinic The most bothersome symptoms from his cervical dystonia are frequent head titubation, which often noticed by his colleagues, he also complains of posterior neck muscle strain, stiffness,  UPDATE Jul 27 2020: I reviewed [facility] evaluation by Dr. Michaell Cowing on June 16, 2020, confirmed diagnosis of cervical dystonia, suggest continue Botox injection, not a candidate for deep brain stimulation yet, may try clonazepam or Artane as needed, physical therapy,  Reported 80% improvement to his previous injection, had much less head titubation following injection, no significant side effect noted  He has anxiety, complains of insomnia, he was given prescription of clonazepam 0.5 mg every night, which has helped him sleep,  Update November 02, 2020: We used Xeomin 200 units during previous injection, he responded well, but during the past 2 weeks, he noticed wearing off, increased head titubation, also tendency of turning his neck towards the right shoulder, he reported significant improvement with physical therapy, stretching exercise, no significant neck or shoulder muscle pain  His sleep has improved as well, no longer requires sleeping agent, has stopped taking trazodone  UPDATE February 01 2021: He responded very well to previous injection no significant side effect noted  Update May 03, 2021: He responded well to previous injection, the most bothersome symptoms are frequent head titubation, and feeling his neck turning towards the right side, there was no significant pain  Update August 09, 2021: He responded well  to previous injection, no significant neck pain, is  bothered by his frequent no-no head shaking, neck turning towards the right side  UPDATE Nov 09 2021: He responded very well to previous injection, no significant side effect noted.  UPDate Feb 08, 2022, He responded to 100 units xeomin injection well, denies significant pain, the most bothersome symptoms is intermittent head tremor, wants to continue injection  Update May 22, 2022: Previous injection did help him, no significant side effect, no significant neck pain  Update August 28, 2022: He responded well to previous injection, noted increased head shaking 2 weeks out from previous scheduled time,  PHYSICAL EXAM   Vitals:   08/28/22 1628  BP: 113/75  Pulse: 63  Weight: 175 lb (79.4 kg)  Height: 5\' 11"  (1.803 m)    Body mass index is 24.41 kg/m.  PHYSICAL EXAMNIATION: He has mild left shoulder elevation, with mild left tilt, mild retrocollis, mild right turn,  good range of motion, occasional no-no head titubation  ASSESSMENT AND PLAN  Keith Faulkner is a 51 y.o. male   Cervical dystonia:   We used Xeomin 100 units (100 units is dissolved into 2 cc of normal saline )  under EMG guidance  Left levator scapular 25 units Right splenius capitis 25 Right splenius cervix 25 units   Right longissimus capitis 12.5 units Left upper trapezius 12.5 units      44, M.D. Ph.D.  [facility] [address] [place], [place] Ph: [phone] Fax: [phone]  CC: Referring Provider

## 2022-11-28 ENCOUNTER — Ambulatory Visit: Payer: BC Managed Care – PPO | Admitting: Neurology

## 2022-11-28 VITALS — BP 110/7 | HR 77

## 2022-11-28 DIAGNOSIS — G243 Spasmodic torticollis: Secondary | ICD-10-CM

## 2022-11-28 MED ORDER — INCOBOTULINUMTOXINA 100 UNITS IM SOLR
100.0000 [IU] | Freq: Once | INTRAMUSCULAR | Status: AC
  Administered 2022-11-28: 100 [IU] via INTRAMUSCULAR

## 2022-11-28 MED ORDER — INCOBOTULINUMTOXINA 100 UNITS IM SOLR: 100.0000 [IU] | INTRAMUSCULAR | Status: DC

## 2022-11-28 NOTE — Progress Notes (Signed)
Xeomin 100 units x 1 vial [id number] Exp-05-2024 B/B

## 2022-11-28 NOTE — Progress Notes (Signed)
PATIENT: Keith Faulkner DOB: 1970-12-28  Chief Complaint  Patient presents with   Procedure    Rm 14, Doing well with xeomin    HISTORICAL  Keith Faulkner is a 52 year old male, seen in request by Dr. Kristeen Miss for evaluation of abnormal neck posturing  He was previously healthy, without clear triggers, he began to notice abnormal neck posturing since summer 2019, he has difficulty finding a comfortable position, later noticed constant neck pulling towards the left shoulder, leaning backwards, slow progressive, denies significant radiating pain to bilateral shoulder, or upper extremity, he denies gait abnormality  He was seen by chiropractor, with only minimal improvement, was later referred to have MRI of cervical spine [facility] in January 2020, C1-2, mild counterclockwise rotation of C1 relative to mild clockwise rotation of C2, with corresponding mild posterior positioning of the right lateral mass of C1 relative to right lateral mass of C2, mild multilevel degenerative changes, there was no significant canal or foraminal narrowing, no evidence of cord compression.  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, potential side effect explained, He was noted to have mild left shoulder elevation, mild retrocollis, moderate right turn, mild left tilt, significant atrophy of right upper cervical paraspinal muscles, tenderness,  UPDATE March 22 2020: Reported 80% improvement with his first EMG guided Xeomin injection in May 2020, he lost follow-up due to concern of pandemic, he noticed increased abnormal neck posturing, forceful turning towards the right side, also head titubation, want to resume Xeomin injection, there was no significant side effect from previous injectionI called the patient and scheduled his Xeomin injection for 04/21/20.  UPDATE April 24 2020: He did  not continue 3 months scheduled Xeomin injection for his cervical dystonia in 2020, despite significant improvement in May 2020, he has a lot of cervical dystonia related questions, we have discussed the treatment option, decided to continue injection now, we are also seeking second opinion, I have referred him to [facility] neuromuscular clinic The most bothersome symptoms from his cervical dystonia are frequent head titubation, which often noticed by his colleagues, he also complains of posterior neck muscle strain, stiffness,  UPDATE Jul 27 2020: I reviewed [facility] evaluation by Dr. Raoul Pitch on June 16, 2020, confirmed diagnosis of cervical dystonia, suggest continue Botox injection, not a candidate for deep brain stimulation yet, may try clonazepam or Artane as needed, physical therapy,  Reported 80% improvement to his previous injection, had much less head titubation following injection, no significant side effect noted  He has anxiety, complains of insomnia, he was given prescription of clonazepam 0.5 mg every night, which has helped him sleep,  Update November 02, 2020: We used Xeomin 200 units during previous injection, he responded well, but during the past 2 weeks, he noticed wearing off, increased head titubation, also tendency of turning his neck towards the right shoulder, he reported significant improvement with physical therapy, stretching exercise, no significant neck or shoulder muscle pain  His sleep has improved as well, no longer requires sleeping agent, has stopped taking trazodone  UPDATE February 01 2021: He responded very well to previous injection no significant side effect noted  Update May 03, 2021: He responded well to previous injection, the most bothersome symptoms are frequent head titubation, and feeling his neck turning towards the right side, there was no significant pain  Update August 09, 2021:  He responded well to previous injection, no  significant neck pain, is bothered by his frequent no-no head shaking, neck turning towards the right side  UPDATE Nov 09 2021: He responded very well to previous injection, no significant side effect noted.  UPDate Feb 08, 2022, He responded to 100 units xeomin injection well, denies significant pain, the most bothersome symptoms is intermittent head tremor, wants to continue injection  Update May 22, 2022: Previous injection did help him, no significant side effect, no significant neck pain  Update August 28, 2022: He responded well to previous injection, noted increased head shaking 2 weeks out from previous scheduled time,  Update November 28, 2022: He did well with his previous injection, no side effect noted  PHYSICAL EXAM   Vitals:   11/28/22 1612  BP: (!) 110/7  Pulse: 77    There is no height or weight on file to calculate BMI.  PHYSICAL EXAMNIATION: He has mild left shoulder elevation, with mild left tilt, mild retrocollis, mild right turn,  good range of motion, occasional no-no head titubation  ASSESSMENT AND PLAN  Keith Faulkner is a 52 y.o. male   Cervical dystonia:   We used Xeomin 100 units (100 units is dissolved into 2 cc of normal saline )  under EMG guidance  Left levator scapular 25 units Right splenius capitis 25 Right splenius cervix 37.5 units   Right longissimus capitis 12.5 units       Marcial Pacas, M.D. Ph.D.  [facility] [address], [place], [place] [address] Ph: [phone] Fax: [phone]  CC: Referring Provider

## 2023-01-28 ENCOUNTER — Other Ambulatory Visit (HOSPITAL_COMMUNITY): Payer: Self-pay

## 2023-01-28 ENCOUNTER — Telehealth: Payer: Self-pay | Admitting: Neurology

## 2023-01-28 NOTE — Telephone Encounter (Signed)
New auth needed before 02/27/23 Xeomin appt, current auth expires 02/07/23.

## 2023-01-28 NOTE — Telephone Encounter (Signed)
Submitted a Benefits Verification Investigation via the Xeomin portal.

## 2023-01-31 ENCOUNTER — Other Ambulatory Visit (HOSPITAL_COMMUNITY): Payer: Self-pay

## 2023-02-04 ENCOUNTER — Other Ambulatory Visit (HOSPITAL_COMMUNITY): Payer: Self-pay

## 2023-02-04 NOTE — Telephone Encounter (Signed)
Submitted PA Request along with clinicals to [employer] at [phone] update once we receive a response.

## 2023-02-05 ENCOUNTER — Telehealth: Payer: Self-pay | Admitting: Neurology

## 2023-02-05 DIAGNOSIS — G243 Spasmodic torticollis: Secondary | ICD-10-CM

## 2023-02-05 NOTE — Telephone Encounter (Signed)
Pt states he was told at his last appt that he was going to be getting a referral for PT but it doesn't look like it was ever ordered.

## 2023-02-05 NOTE — Telephone Encounter (Signed)
Orders Placed This Encounter  Procedures  . Ambulatory referral to Physical Therapy      

## 2023-02-05 NOTE — Addendum Note (Signed)
Addended by: Levert Feinstein on: 02/05/2023 05:08 PM   Modules accepted: Orders

## 2023-02-13 ENCOUNTER — Encounter: Payer: Self-pay | Admitting: Physical Therapy

## 2023-02-13 ENCOUNTER — Ambulatory Visit: Payer: BC Managed Care – PPO | Attending: Neurology | Admitting: Physical Therapy

## 2023-02-13 DIAGNOSIS — R293 Abnormal posture: Secondary | ICD-10-CM | POA: Diagnosis present

## 2023-02-13 DIAGNOSIS — G243 Spasmodic torticollis: Secondary | ICD-10-CM | POA: Insufficient documentation

## 2023-02-13 NOTE — Therapy (Signed)
OUTPATIENT PHYSICAL THERAPY CERVICAL EVALUATION   Patient Name: Keith Faulkner MRN: [id number] DOB:01/07/1971, 52 y.o., male Today's Date: 02/13/2023  END OF SESSION:  PT End of Session - 02/13/23 0858     Visit Number 1    Number of Visits 2    Date for PT Re-Evaluation 03/15/23    Authorization Type [employer]BS    PT Start Time 0802    PT Stop Time 0840    PT Time Calculation (min) 38 min    Activity Tolerance Patient tolerated treatment well    Behavior During Therapy WFL for tasks assessed/performed             Past Medical History:  Diagnosis Date   Degenerative disc disease, cervical    Torticollis    History reviewed. No pertinent surgical history. Patient Active Problem List   Diagnosis Date Noted   Chronic insomnia 07/27/2020   Cervical dystonia 01/21/2019   Abdominal pain, epigastric 10/17/2011    PCP: No PCP   REFERRING PROVIDER: Levert Feinstein, MD   REFERRING DIAG: G24.3 (ICD-10-CM) - Cervical dystonia  THERAPY DIAG:  Abnormal posture  Rationale for Evaluation and Treatment: Rehabilitation  ONSET DATE: 02/05/2023  SUBJECTIVE:                                                                                                                                                                                                         SUBJECTIVE STATEMENT: Needs to get back on track, had PT back in 2021 and was given exercises from here and [facility]. And needs to do them more. Feels like his neck is getting tighter and not getting as mobile. Wants to get re-focused again. Sees Dr. Terrace Arabia every 90 days for Botox injections and reports that this works ok. Reports last time he was in PT, noticed that he was more looser. Reports it is more of a stiffness rather than pain. Does a lot of sitting down the day, and tries to get up a bit and move around. Mainly walking every once in a while and starting to pick it back up. Has a TENS unit he got from [place], just hasn't used it in a while.  Felt it was more effective before going to bed.   Hand dominance: Right  PERTINENT HISTORY:  PMH: Cervical dystonia   Pt began to notice abnormal neck posturing since summer 2019, he has difficulty finding a comfortable position, later noticed constant neck pulling towards the left shoulder, leaning backwards, slow progressive, denies significant radiating pain to bilateral shoulder, or upper extremity, he denies  gait abnormality   He was seen by chiropractor, with only minimal improvement, was later referred to have MRI of cervical spine [facility] in January 2020, C1-2, mild counterclockwise rotation of C1 relative to mild clockwise rotation of C2, with corresponding mild posterior positioning of the right lateral mass of C1 relative to right lateral mass of C2, mild multilevel degenerative changes, there was no significant canal or foraminal narrowing, no evidence of cord compression.   PAIN:  Are you having pain? No and just feeling more stiffness   PRECAUTIONS: None  WEIGHT BEARING RESTRICTIONS: No  FALLS:  Has patient fallen in last 6 months? 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  PLOF: Independent  PATIENT GOALS: Wants to get back on track with an exercise program.   NEXT MD VISIT: Sees Dr. Terrace Arabia 03/28/23  OBJECTIVE:   DIAGNOSTIC FINDINGS:  MRI of cervical spine [facility] in January 2020, C1-2, mild counterclockwise rotation of C1 relative to mild clockwise rotation of C2, with corresponding mild posterior positioning of the right lateral mass of C1 relative to right lateral mass of C2, mild multilevel degenerative changes, there was no significant canal or foraminal narrowing, no evidence of cord compression.    COGNITION: Overall cognitive status: Within functional limits for tasks assessed   POSTURE: rounded shoulders, forward head, and R shoulder lower than L; pt holds head in tilted to L, and rotated to R.     PALPATION: TTP along  BUE upper traps, levator scap.    CERVICAL ROM:   Active ROM A/PROM (deg) eval  Flexion 50  Extension 40  Right lateral flexion 48  Left lateral flexion 35  Right rotation 55  Left rotation 50   (Blank rows = not tested)  UPPER EXTREMITY ROM: AROM Right eval Left eval  Shoulder flexion [facility]  Shoulder extension    Shoulder abduction WFL, feels a little tighter at end range [facility], feels a little tighter at end range  Shoulder adduction    Shoulder extension    Shoulder internal rotation WFL, reaching behind back slightly tighter WFL, reaching behind back slightly tighter  Shoulder external rotation [facility] WFL    (Blank rows = not tested)  UPPER EXTREMITY MMT:  Grossly 5/5 BUE MMT    TODAY'S TREATMENT:                                                                                                                              DATE: 02/13/23  Reviewed previous HEP (pt brought his exercises in a binder)   Supine exercises: Open book x10 reps  Cervical retraction x10 reps, discussed can also perform in sitting   Prone PWR Up x10 reps (when turning head to R/L when coming down), Rock x10 reps, Step x10 reps   PATIENT EDUCATION:  Education details: Reviewed HEP and performing some during the morning, during work, and after work to split up during the day. Introducing  breathing exercises when more stressed to help decr tension. Scheduling 1 visit in a month as a follow-up as needed, if pt is feeling less stiff and better before then after re-starting exercises, then can cancel appt Person educated: Patient Education method: Explanation, Demonstration, and Verbal cues Education comprehension: verbalized understanding and returned demonstration  HOME EXERCISE PROGRAM: Pt has exercises in a binder from [facility]/previous bout of PT   ASSESSMENT:  CLINICAL IMPRESSION:  Patient is a 52 year old male referred to Neuro OPPT for Cervical dystonia.  Pt sees Dr. Terrace Arabia every 90 days for botox  injections. Pt was seen by PT a few years ago and was given an HEP and pt wants to get back into doing it to decr stiffness and improve ROM, esp since pt sits most of the day during work. The following deficits were present during the exam: postural abnormalities, decr flexibility, tenderness to palpation to upper traps/levator scap, cervical spine hypomobility, decr AROM. Began to review pt's previous HEP from bout of therapy, with cued to try to spread exercises out during the day. Pt reporting feeling relief when performing these previously for stiffness. Will schedule 1 follow-up visit as needed, but if pt is feeling less stiff from doing his exercises consistently, then can call and cancel. Pt in agreement with plan.    OBJECTIVE IMPAIRMENTS: decreased ROM, hypomobility, impaired flexibility, and postural dysfunction.   ACTIVITY LIMITATIONS:  N/A, just incr stiffness  PARTICIPATION LIMITATIONS: N/A, just incr stiffness  PERSONAL FACTORS: Time since onset of injury/illness/exacerbation and 1 comorbidity: Cervical dystonia  are also affecting patient's functional outcome.   REHAB POTENTIAL: Good  CLINICAL DECISION MAKING: Stable/uncomplicated  EVALUATION COMPLEXITY: Low   GOALS: Goals reviewed with patient? Yes  SHORT TERM GOALS:  ALL STGS = LTGS  LONG TERM GOALS: Target date: 03/13/2023  Pt will be independent with HEP for improved flexibility, postural strengthening. Baseline:  Goal status: INITIAL  2.  Pt will improve cervical rotation AROM to at least 60 degrees in order to demo improved AROM.  Baseline:  Right rotation 55  Left rotation 50   Goal status: INITIAL  3.  Pt will improve L cervical lateral flexion to at least 40 degrees in order to demo improved flexibility.  Baseline:  Left lateral flexion 35   Goal status: INITIAL    PLAN:  PT FREQUENCY: 1x/week  PT DURATION: 4 weeks  PLANNED INTERVENTIONS: Therapeutic exercises, Therapeutic activity,  Neuromuscular re-education, Balance training, Gait training, Patient/Family education, Self Care, and Manual therapy  PLAN FOR NEXT SESSION: How are things going? Improvement with exercises?    Drake Leach, PT, DPT  02/13/2023, 8:59 AM

## 2023-02-19 NOTE — Telephone Encounter (Signed)
Outreached insurance to follow up-I sent in requested add info-insurance did not receive-faxed the request along with clinicals to [phone] as well as [phone].

## 2023-02-20 ENCOUNTER — Other Ambulatory Visit (HOSPITAL_COMMUNITY): Payer: Self-pay

## 2023-02-20 NOTE — Telephone Encounter (Signed)
Spoke with insurance to follow up and the authorization has been approved.  Pharmacy Patient Advocate Encounter- Botox BIV-Medical Benefit:  J code: [id number] CPT code: 60454, [phone] Dx Code: G24.3  PA was submitted to [employer] [place] and has been approved through: 02/20/2024 Authorization# [id number]  Buy and Bill  Call Reference# BelindaK. 12:31PM Central 02/20/2023

## 2023-02-27 ENCOUNTER — Other Ambulatory Visit (HOSPITAL_COMMUNITY): Payer: Self-pay

## 2023-02-27 ENCOUNTER — Ambulatory Visit: Payer: BC Managed Care – PPO | Admitting: Neurology

## 2023-03-13 ENCOUNTER — Ambulatory Visit: Payer: BC Managed Care – PPO | Admitting: Physical Therapy

## 2023-03-28 ENCOUNTER — Ambulatory Visit: Payer: BC Managed Care – PPO | Admitting: Neurology

## 2023-03-28 VITALS — BP 110/73

## 2023-03-28 DIAGNOSIS — G243 Spasmodic torticollis: Secondary | ICD-10-CM | POA: Diagnosis not present

## 2023-03-28 MED ORDER — INCOBOTULINUMTOXINA 100 UNITS IM SOLR
100.0000 [IU] | Freq: Once | INTRAMUSCULAR | Status: AC
  Administered 2023-03-28: 100 [IU] via INTRAMUSCULAR

## 2023-03-28 NOTE — Progress Notes (Signed)
xenomin 100units x 1 vial  [id number] Exp-03.2026 B/B Bacteriostatic 0.9% Sodium Chloride- 2mL  FAO:[id number] Expiration: 04.01.2025 NDC: [id number] Dx: g24.3 WITNESSED WU:XLKGM, RN

## 2023-03-28 NOTE — Progress Notes (Signed)
PATIENT: Keith Faulkner DOB: [birth year]  Chief Complaint  Patient presents with   Procedure    Rm14 alone, pt is well and ready for xenomin injection    HISTORICAL  Keith Faulkner is a 52 year old male, seen in request by Dr. Barnett Abu for evaluation of abnormal neck posturing  He was previously healthy, without clear triggers, he began to notice abnormal neck posturing since summer 2019, he has difficulty finding a comfortable position, later noticed constant neck pulling towards the left shoulder, leaning backwards, slow progressive, denies significant radiating pain to bilateral shoulder, or upper extremity, he denies gait abnormality  He was seen by chiropractor, with only minimal improvement, was later referred to have MRI of cervical spine [facility] in January 2020, C1-2, mild counterclockwise rotation of C1 relative to mild clockwise rotation of C2, with corresponding mild posterior positioning of the right lateral mass of C1 relative to right lateral mass of C2, mild multilevel degenerative changes, there was no significant canal or foraminal narrowing, no evidence of cord compression.  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, potential side effect explained, He was noted to have mild left shoulder elevation, mild retrocollis, moderate right turn, mild left tilt, significant atrophy of right upper cervical paraspinal muscles, tenderness,  UPDATE March 22 2020: Reported 80% improvement with his first EMG guided Xeomin injection in May 2020, he lost follow-up due to concern of pandemic, he noticed increased abnormal neck posturing, forceful turning towards the right side, also head titubation, want to resume Xeomin injection, there was no significant side effect from previous injectionI called the patient and scheduled his Xeomin injection for  04/21/20.  UPDATE April 24 2020: He did not continue 3 months scheduled Xeomin injection for his cervical dystonia in 2020, despite significant improvement in May 2020, he has a lot of cervical dystonia related questions, we have discussed the treatment option, decided to continue injection now, we are also seeking second opinion, I have referred him to [facility] neuromuscular clinic The most bothersome symptoms from his cervical dystonia are frequent head titubation, which often noticed by his colleagues, he also complains of posterior neck muscle strain, stiffness,  UPDATE Jul 27 2020: I reviewed [facility] evaluation by Dr. Michaell Cowing on June 16, 2020, confirmed diagnosis of cervical dystonia, suggest continue Botox injection, not a candidate for deep brain stimulation yet, may try clonazepam or Artane as needed, physical therapy,  Reported 80% improvement to his previous injection, had much less head titubation following injection, no significant side effect noted  He has anxiety, complains of insomnia, he was given prescription of clonazepam 0.5 mg every night, which has helped him sleep,  Update November 02, 2020: We used Xeomin 200 units during previous injection, he responded well, but during the past 2 weeks, he noticed wearing off, increased head titubation, also tendency of turning his neck towards the right shoulder, he reported significant improvement with physical therapy, stretching exercise, no significant neck or shoulder muscle pain  His sleep has improved as well, no longer requires sleeping agent, has stopped taking trazodone  UPDATE February 01 2021: He responded very well to previous injection no significant side effect noted  Update May 03, 2021: He responded well to previous injection, the most bothersome symptoms are frequent head titubation, and feeling his neck turning towards the right side, there was no significant pain  Update August 09, 2021: He  responded well to previous injection, no significant neck pain, is bothered by his frequent no-no head shaking, neck turning towards the right side  UPDATE Nov 09 2021: He responded very well to previous injection, no significant side effect noted.  UPDate Feb 08, 2022, He responded to 100 units xeomin injection well, denies significant pain, the most bothersome symptoms is intermittent head tremor, wants to continue injection  Update May 22, 2022: Previous injection did help him, no significant side effect, no significant neck pain  Update August 28, 2022: He responded well to previous injection, noted increased head shaking 2 weeks out from previous scheduled time,  Update November 28, 2022: He did well with his previous injection, no side effect noted  UPDATE March 28 2023:   PHYSICAL EXAM   Vitals:   03/28/23 0829  BP: 110/73    PHYSICAL EXAMNIATION: He has mild left shoulder elevation, with mild left tilt, mild retrocollis, mild right turn,  good range of motion, occasional no-no head titubation  ASSESSMENT AND PLAN  Keith Faulkner is a 52 y.o. male   Cervical dystonia:   We used Xeomin 100 units (100 units is dissolved into 2 cc of normal saline )  under EMG guidance  Left levator scapular 25 units Right oblique capitis inferior 12.5 units Right splenius capitis 25 Right splenius cervix 25 units   Right longissimus capitis 12.5 units       Levert Feinstein, M.D. Ph.D.  [facility] [address], [address] [place], [place] [address] Ph: [phone] Fax: [phone]  CC: Referring Provider

## 2023-06-26 ENCOUNTER — Ambulatory Visit: Payer: BC Managed Care – PPO | Admitting: Neurology

## 2023-06-26 VITALS — BP 109/77

## 2023-06-26 DIAGNOSIS — G243 Spasmodic torticollis: Secondary | ICD-10-CM | POA: Diagnosis not present

## 2023-06-26 MED ORDER — INCOBOTULINUMTOXINA 100 UNITS IM SOLR
100.0000 [IU] | INTRAMUSCULAR | Status: AC
  Administered 2023-06-26: 100 [IU] via INTRAMUSCULAR

## 2023-06-26 NOTE — Progress Notes (Signed)
PATIENT: Keith Faulkner DOB: 27-Jan-1971  Chief Complaint  Patient presents with   Procedure    RM14 ALONE, PT IS WELL AND READY FOR INJECTION    HISTORICAL  Keith Faulkner is a 52 year old male, seen in request by Dr. Barnett Abu for evaluation of abnormal neck posturing  He was previously healthy, without clear triggers, he began to notice abnormal neck posturing since summer 2019, he has difficulty finding a comfortable position, later noticed constant neck pulling towards the left shoulder, leaning backwards, slow progressive, denies significant radiating pain to bilateral shoulder, or upper extremity, he denies gait abnormality  He was seen by chiropractor, with only minimal improvement, was later referred to have MRI of cervical spine [facility] in January 2020, C1-2, mild counterclockwise rotation of C1 relative to mild clockwise rotation of C2, with corresponding mild posterior positioning of the right lateral mass of C1 relative to right lateral mass of C2, mild multilevel degenerative changes, there was no significant canal or foraminal narrowing, no evidence of cord compression.  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, potential side effect explained, He was noted to have mild left shoulder elevation, mild retrocollis, moderate right turn, mild left tilt, significant atrophy of right upper cervical paraspinal muscles, tenderness,  UPDATE March 22 2020: Reported 80% improvement with his first EMG guided Xeomin injection in May 2020, he lost follow-up due to concern of pandemic, he noticed increased abnormal neck posturing, forceful turning towards the right side, also head titubation, want to resume Xeomin injection, there was no significant side effect from previous injectionI called the patient and scheduled his Xeomin injection for 04/21/20.  UPDATE April 24 2020: He did not continue 3 months scheduled Xeomin injection for his cervical dystonia in 2020, despite significant improvement in May 2020, he has a lot of cervical dystonia related questions, we have discussed the treatment option, decided to continue injection now, we are also seeking second opinion, I have referred him to [facility] neuromuscular clinic The most bothersome symptoms from his cervical dystonia are frequent head titubation, which often noticed by his colleagues, he also complains of posterior neck muscle strain, stiffness,  UPDATE Jul 27 2020: I reviewed [facility] evaluation by Dr. Michaell Cowing on June 16, 2020, confirmed diagnosis of cervical dystonia, suggest continue Botox injection, not a candidate for deep brain stimulation yet, may try clonazepam or Artane as needed, physical therapy,  Reported 80% improvement to his previous injection, had much less head titubation following injection, no significant side effect noted  He has anxiety, complains of insomnia, he was given prescription of clonazepam 0.5 mg every night, which has helped him sleep,  Update November 02, 2020: We used Xeomin 200 units during previous injection, he responded well, but during the past 2 weeks, he noticed wearing off, increased head titubation, also tendency of turning his neck towards the right shoulder, he reported significant improvement with physical therapy, stretching exercise, no significant neck or shoulder muscle pain  His sleep has improved as well, no longer requires sleeping agent, has stopped taking trazodone  UPDATE February 01 2021: He responded very well to previous injection no significant side effect noted  Update May 03, 2021: He responded well to previous injection, the most bothersome symptoms are frequent head titubation, and feeling his neck turning towards the right side, there was no significant pain  Update  August 09, 2021: He responded well to previous  injection, no significant neck pain, is bothered by his frequent no-no head shaking, neck turning towards the right side  UPDATE Nov 09 2021: He responded very well to previous injection, no significant side effect noted.  UPDate Feb 08, 2022, He responded to 100 units xeomin injection well, denies significant pain, the most bothersome symptoms is intermittent head tremor, wants to continue injection  Update May 22, 2022: Previous injection did help him, no significant side effect, no significant neck pain  Update August 28, 2022: He responded well to previous injection, noted increased head shaking 2 weeks out from previous scheduled time,  Update November 28, 2022: He did well with his previous injection, no side effect noted  UPDATE Sept 18 2024: He did well with previous injection  PHYSICAL EXAM   Vitals:   06/26/23 0903  BP: 109/77    PHYSICAL EXAMNIATION: He has slight left shoulder elevation, with mild left tilt, mild retrocollis, mild right turn,  good range of motion, occasional no-no head titubation  ASSESSMENT AND PLAN  Keith Faulkner is a 52 y.o. male   Cervical dystonia:   We used Xeomin 100 units (100 units is dissolved into 2 cc of normal saline )  under EMG guidance  Left levator scapular 25 units Right oblique capitis inferior 12.5 units Right splenius capitis 25 Right splenius cervix 25 units   Right longissimus capitis 12.5 units       Levert Feinstein, M.D. Ph.D.  [facility] [address] [place], [place] [address] Ph: [phone] Fax: [phone]  CC: Referring Provider

## 2023-06-26 NOTE — Progress Notes (Signed)
xenomin 100units x 1 vial  Ndc-0259-1610-01 [id number] Exp-2026/10 B/B  Bacteriostatic 0.9% Sodium Chloride- 2mL  [id number] Expiration: 01/07/24 NDC: 3086-5784-69 Dx:  Cervical dystonia  - Primary G24.3   WITNESSED GE:XBMWU CMA

## 2023-09-25 ENCOUNTER — Ambulatory Visit: Payer: BC Managed Care – PPO | Admitting: Neurology

## 2023-09-25 VITALS — BP 131/81

## 2023-09-25 DIAGNOSIS — G243 Spasmodic torticollis: Secondary | ICD-10-CM

## 2023-09-25 MED ORDER — INCOBOTULINUMTOXINA 100 UNITS IM SOLR
100.0000 [IU] | INTRAMUSCULAR | Status: AC
  Administered 2023-09-25: 100 [IU] via INTRAMUSCULAR

## 2023-09-25 NOTE — Progress Notes (Signed)
xeomin 100units x 1 vial  Ndc-0259-1610-01 [id number] Exp-01/27 B/B  Bacteriostatic 0.9% Sodium Chloride- 2mL  [id number]:[id number] Expiration: 08/08/24 NDC: [id number] Dx:   Mayer Camel   WITNESSED PP:IRJJO Yetta Barre RN

## 2023-09-25 NOTE — Progress Notes (Signed)
PATIENT: Keith Faulkner DOB: 09-14-1971  Chief Complaint  Patient presents with   Injections    Rm14, alone, pt is well and ready for injection    HISTORICAL  Keith Faulkner is a 52 year old male, seen in request by Dr. Barnett Abu for evaluation of abnormal neck posturing  He was previously healthy, without clear triggers, he began to notice abnormal neck posturing since summer 2019, he has difficulty finding a comfortable position, later noticed constant neck pulling towards the left shoulder, leaning backwards, slow progressive, denies significant radiating pain to bilateral shoulder, or upper extremity, he denies gait abnormality  He was seen by chiropractor, with only minimal improvement, was later referred to have MRI of cervical spine [facility] in January 2020, C1-2, mild counterclockwise rotation of C1 relative to mild clockwise rotation of C2, with corresponding mild posterior positioning of the right lateral mass of C1 relative to right lateral mass of C2, mild multilevel degenerative changes, there was no significant canal or foraminal narrowing, no evidence of cord compression.  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, potential side effect explained, He was noted to have mild left shoulder elevation, mild retrocollis, moderate right turn, mild left tilt, significant atrophy of right upper cervical paraspinal muscles, tenderness,  UPDATE March 22 2020: Reported 80% improvement with his first EMG guided Xeomin injection in May 2020, he lost follow-up due to concern of pandemic, he noticed increased abnormal neck posturing, forceful turning towards the right side, also head titubation, want to resume Xeomin injection, there was no significant side effect from previous injectionI called the patient and scheduled his Xeomin injection for 04/21/20.  UPDATE April 24 2020: He did not continue 3 months scheduled Xeomin injection for his cervical dystonia in 2020, despite significant improvement in May 2020, he has a lot of cervical dystonia related questions, we have discussed the treatment option, decided to continue injection now, we are also seeking second opinion, I have referred him to [facility] neuromuscular clinic The most bothersome symptoms from his cervical dystonia are frequent head titubation, which often noticed by his colleagues, he also complains of posterior neck muscle strain, stiffness,  UPDATE Jul 27 2020: I reviewed [facility] evaluation by Dr. Michaell Cowing on June 16, 2020, confirmed diagnosis of cervical dystonia, suggest continue Botox injection, not a candidate for deep brain stimulation yet, may try clonazepam or Artane as needed, physical therapy,  Reported 80% improvement to his previous injection, had much less head titubation following injection, no significant side effect noted  He has anxiety, complains of insomnia, he was given prescription of clonazepam 0.5 mg every night, which has helped him sleep,  Update November 02, 2020: We used Xeomin 200 units during previous injection, he responded well, but during the past 2 weeks, he noticed wearing off, increased head titubation, also tendency of turning his neck towards the right shoulder, he reported significant improvement with physical therapy, stretching exercise, no significant neck or shoulder muscle pain  His sleep has improved as well, no longer requires sleeping agent, has stopped taking trazodone  UPDATE February 01 2021: He responded very well to previous injection no significant side effect noted  Update May 03, 2021: He responded well to previous injection, the most bothersome symptoms are frequent head titubation, and feeling his neck turning towards the right side, there was no significant pain  Update  August 09, 2021: He responded well to previous  injection, no significant neck pain, is bothered by his frequent no-no head shaking, neck turning towards the right side  UPDATE Nov 09 2021: He responded very well to previous injection, no significant side effect noted.  UPDate Feb 08, 2022, He responded to 100 units xeomin injection well, denies significant pain, the most bothersome symptoms is intermittent head tremor, wants to continue injection  Update May 22, 2022: Previous injection did help him, no significant side effect, no significant neck pain  Update August 28, 2022: He responded well to previous injection, noted increased head shaking 2 weeks out from previous scheduled time,  Update November 28, 2022: He did well with his previous injection, no side effect noted  UPDATE Sept 18 2024: He did well with previous injection  UPDATE Sep 25 2023: He did well to previous injection  PHYSICAL EXAM   Vitals:   09/25/23 0907  BP: 131/81    PHYSICAL EXAMNIATION: He has slight left shoulder elevation, with mild left tilt, mild retrocollis, mild right turn,  good range of motion, occasional no-no head titubation  ASSESSMENT AND PLAN  Keith Faulkner is a 52 y.o. male   Cervical dystonia:   We used Xeomin 100 units (100 units is dissolved into 2 cc of normal saline )  under EMG guidance  Left levator scapular 25 units Right oblique capitis inferior 12.5 units Right splenius capitis 25 Right splenius cervix 25 units   Right longissimus capitis 12.5 units       Levert Feinstein, M.D. Ph.D.  [facility] [address] [place], [place] [address] Ph: [phone] Fax: [phone]  CC: Referring Provider

## 2023-12-03 ENCOUNTER — Telehealth: Payer: Self-pay | Admitting: Neurology

## 2023-12-03 NOTE — Telephone Encounter (Addendum)
 Pt has new insurance, called [employer] @ [phone] to initiate auth. Spoke with Lajoyce Corners, she was able to submit auth with me over the phone. Berkley Harvey is pending review, I faxed clinical notes to [phone]. Pending case # is [id number]. Reference # for call [id number].  [id number]; 100 units G24.3

## 2023-12-11 NOTE — Telephone Encounter (Signed)
 Called [employer] to check status of Berkley Harvey, spoke with May. She states Berkley Harvey is pending review, clinical notes were received and no additional notes are needed. Will check back in a few days. Reference # for today's call is [id number].

## 2023-12-19 NOTE — Telephone Encounter (Signed)
 Called [employer] again to check status, spoke with [place]. She states Berkley Harvey has been approved. Call reference # [id number].  Auth#: [id number] (12/03/23-12/01/24)

## 2023-12-25 ENCOUNTER — Ambulatory Visit: Payer: BC Managed Care – PPO | Admitting: Neurology

## 2023-12-25 ENCOUNTER — Encounter: Payer: Self-pay | Admitting: Neurology

## 2023-12-25 VITALS — BP 124/78 | HR 72 | Ht 70.0 in | Wt 198.0 lb

## 2023-12-25 DIAGNOSIS — G243 Spasmodic torticollis: Secondary | ICD-10-CM

## 2023-12-25 MED ORDER — INCOBOTULINUMTOXINA 100 UNITS IM SOLR
100.0000 [IU] | INTRAMUSCULAR | Status: AC
  Administered 2023-12-25: 100 [IU] via INTRAMUSCULAR

## 2023-12-25 NOTE — Progress Notes (Signed)
 PATIENT: Keith Faulkner DOB: 01/01/1971  Chief Complaint  Patient presents with   Room 14    Pt is here Alone. Pt states that he feels that he is pretty good shape.     HISTORICAL  Keith Faulkner is a 53 year old male, seen in request by Dr. Barnett Abu for evaluation of abnormal neck posturing  He was previously healthy, without clear triggers, he began to notice abnormal neck posturing since summer 2019, he has difficulty finding a comfortable position, later noticed constant neck pulling towards the left shoulder, leaning backwards, slow progressive, denies significant radiating pain to bilateral shoulder, or upper extremity, he denies gait abnormality  He was seen by chiropractor, with only minimal improvement, was later referred to have MRI of cervical spine [facility] in January 2020, C1-2, mild counterclockwise rotation of C1 relative to mild clockwise rotation of C2, with corresponding mild posterior positioning of the right lateral mass of C1 relative to right lateral mass of C2, mild multilevel degenerative changes, there was no significant canal or foraminal narrowing, no evidence of cord compression.  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, potential side effect explained, He was noted to have mild left shoulder elevation, mild retrocollis, moderate right turn, mild left tilt, significant atrophy of right upper cervical paraspinal muscles, tenderness,  UPDATE March 22 2020: Reported 80% improvement with his first EMG guided Xeomin injection in May 2020, he lost follow-up due to concern of pandemic, he noticed increased abnormal neck posturing, forceful turning towards the right side, also head titubation, want to resume Xeomin injection, there was no significant side effect from previous injectionI called the patient and scheduled his Xeomin injection for  04/21/20.  UPDATE April 24 2020: He did not continue 3 months scheduled Xeomin injection for his cervical dystonia in 2020, despite significant improvement in May 2020, he has a lot of cervical dystonia related questions, we have discussed the treatment option, decided to continue injection now, we are also seeking second opinion, I have referred him to [facility] neuromuscular clinic The most bothersome symptoms from his cervical dystonia are frequent head titubation, which often noticed by his colleagues, he also complains of posterior neck muscle strain, stiffness,  UPDATE Jul 27 2020: I reviewed [facility] evaluation by Dr. Michaell Cowing on June 16, 2020, confirmed diagnosis of cervical dystonia, suggest continue Botox injection, not a candidate for deep brain stimulation yet, may try clonazepam or Artane as needed, physical therapy,  Reported 80% improvement to his previous injection, had much less head titubation following injection, no significant side effect noted  He has anxiety, complains of insomnia, he was given prescription of clonazepam 0.5 mg every night, which has helped him sleep,  Update November 02, 2020: We used Xeomin 200 units during previous injection, he responded well, but during the past 2 weeks, he noticed wearing off, increased head titubation, also tendency of turning his neck towards the right shoulder, he reported significant improvement with physical therapy, stretching exercise, no significant neck or shoulder muscle pain  His sleep has improved as well, no longer requires sleeping agent, has stopped taking trazodone  UPDATE February 01 2021: He responded very well to previous injection no significant side effect noted  Update May 03, 2021: He responded well to previous injection, the most bothersome symptoms are frequent head titubation, and feeling his neck turning towards the right  side, there was no significant pain  Update August 09, 2021: He  responded well to previous injection, no significant neck pain, is bothered by his frequent no-no head shaking, neck turning towards the right side  UPDATE Nov 09 2021: He responded very well to previous injection, no significant side effect noted.  UPDate Feb 08, 2022, He responded to 100 units xeomin injection well, denies significant pain, the most bothersome symptoms is intermittent head tremor, wants to continue injection  Update May 22, 2022: Previous injection did help him, no significant side effect, no significant neck pain  Update August 28, 2022: He responded well to previous injection, noted increased head shaking 2 weeks out from previous scheduled time,  Update November 28, 2022: He did well with his previous injection, no side effect noted  UPDATE Sept 18 2024: He did well with previous injection  UPDATE Sep 25 2023: He did well to previous injection  UPDATE December 25 2023: He tolerated the injection well, no significant side effect noted,  PHYSICAL EXAM   Vitals:   12/25/23 1340  BP: 124/78  Pulse: 72  Weight: 198 lb (89.8 kg)  Height: 5\' 10"  (1.778 m)    PHYSICAL EXAMNIATION: He has slight left shoulder elevation, with mild left tilt, mild retrocollis, mild right turn,  good range of motion, occasional no-no head titubation (most active muscle disease at the right upper cervical close to right mastoid region)  ASSESSMENT AND PLAN  Keith Faulkner is a 53 y.o. male   Cervical dystonia:   We used Xeomin 100 units (100 units is dissolved into 2 cc of normal saline )  under EMG guidance  Left levator scapular 12.5 units Left semispinalis 12.5 units  Right oblique capitis inferior 12.5 units Right splenius capitis 25 Right splenius cervix 25 units   Right longissimus capitis 12.5 units       Levert Feinstein, M.D. Ph.D.  [facility] [address] [place], [place] [address] Ph: [phone] Fax: [phone]  CC:  Referring Provider

## 2023-12-25 NOTE — Progress Notes (Signed)
 Xeomin- 100 units x 1 vial [id number]  Expiration: 10/2025 NDC: 7253-6644-03  Bacteriostatic 0.9% Sodium Chloride- 4mL total Lot: [id number] Expiration: 08/08/2024 NDC: 5638-7564-33  Dx: G24.3 B/B Witnessed By: April Jones, RN

## 2024-03-26 ENCOUNTER — Encounter: Payer: Self-pay | Admitting: Neurology

## 2024-03-26 ENCOUNTER — Ambulatory Visit: Admitting: Neurology

## 2024-03-26 VITALS — BP 122/64 | Ht 71.0 in

## 2024-03-26 DIAGNOSIS — G243 Spasmodic torticollis: Secondary | ICD-10-CM | POA: Diagnosis not present

## 2024-03-26 MED ORDER — INCOBOTULINUMTOXINA 100 UNITS IM SOLR
100.0000 [IU] | INTRAMUSCULAR | Status: AC
  Administered 2024-03-27: 100 [IU] via INTRAMUSCULAR

## 2024-03-26 NOTE — Progress Notes (Unsigned)
 PATIENT: Keith Faulkner DOB: [birth year]  Chief Complaint  Patient presents with   xeomin     Rm 14,     HISTORICAL  Keith Faulkner is a 53 year old male, seen in request by Dr. Elna Haggis for evaluation of abnormal neck posturing  He was previously healthy, without clear triggers, he began to notice abnormal neck posturing since summer 2019, he has difficulty finding a comfortable position, later noticed constant neck pulling towards the left shoulder, leaning backwards, slow progressive, denies significant radiating pain to bilateral shoulder, or upper extremity, he denies gait abnormality  He was seen by chiropractor, with only minimal improvement, was later referred to have MRI of cervical spine [facility] in January 2020, C1-2, mild counterclockwise rotation of C1 relative to mild clockwise rotation of C2, with corresponding mild posterior positioning of the right lateral mass of C1 relative to right lateral mass of C2, mild multilevel degenerative changes, there was no significant canal or foraminal narrowing, no evidence of cord compression.  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, potential side effect explained, He was noted to have mild left shoulder elevation, mild retrocollis, moderate right turn, mild left tilt, significant atrophy of right upper cervical paraspinal muscles, tenderness,  UPDATE March 22 2020: Reported 80% improvement with his first EMG guided Xeomin  injection in May 2020, he lost follow-up due to concern of pandemic, he noticed increased abnormal neck posturing, forceful turning towards the right side, also head titubation, want to resume Xeomin  injection, there was no significant side effect from previous injectionI called the patient and scheduled his Xeomin  injection for 04/21/20.  UPDATE April 24 2020: He did not continue 3 months  scheduled Xeomin  injection for his cervical dystonia in 2020, despite significant improvement in May 2020, he has a lot of cervical dystonia related questions, we have discussed the treatment option, decided to continue injection now, we are also seeking second opinion, I have referred him to [facility] neuromuscular clinic The most bothersome symptoms from his cervical dystonia are frequent head titubation, which often noticed by his colleagues, he also complains of posterior neck muscle strain, stiffness,  UPDATE Jul 27 2020: I reviewed [facility] evaluation by Dr. Katherleen Pancoast on June 16, 2020, confirmed diagnosis of cervical dystonia, suggest continue Botox injection, not a candidate for deep brain stimulation yet, may try clonazepam or Artane as needed, physical therapy,  Reported 80% improvement to his previous injection, had much less head titubation following injection, no significant side effect noted  He has anxiety, complains of insomnia, he was given prescription of clonazepam 0.5 mg every night, which has helped him sleep,  Update November 02, 2020: We used Xeomin  200 units during previous injection, he responded well, but during the past 2 weeks, he noticed wearing off, increased head titubation, also tendency of turning his neck towards the right shoulder, he reported significant improvement with physical therapy, stretching exercise, no significant neck or shoulder muscle pain  His sleep has improved as well, no longer requires sleeping agent, has stopped taking trazodone   UPDATE February 01 2021: He responded very well to previous injection no significant side effect noted  Update May 03, 2021: He responded well to previous injection, the most bothersome symptoms are frequent head titubation, and feeling his neck turning towards the right side, there was no significant pain  Update August 09, 2021: He responded well  to previous injection, no significant neck pain, is  bothered by his frequent no-no head shaking, neck turning towards the right side  UPDATE Nov 09 2021: He responded very well to previous injection, no significant side effect noted.  UPDate Feb 08, 2022, He responded to 100 units xeomin  injection well, denies significant pain, the most bothersome symptoms is intermittent head tremor, wants to continue injection  Update May 22, 2022: Previous injection did help him, no significant side effect, no significant neck pain  Update August 28, 2022: He responded well to previous injection, noted increased head shaking 2 weeks out from previous scheduled time,  Update November 28, 2022: He did well with his previous injection, no side effect noted  UPDATE Sept 18 2024: He did well with previous injection  UPDATE Sep 25 2023: He did well to previous injection  UPDATE December 25 2023: He tolerated the injection well, no significant side effect noted,  UPDATE March 26 2024: He responded well to previous injection  PHYSICAL EXAM   Vitals:   03/26/24 1129  BP: 122/64  Height: 5' 11 (1.803 m)    PHYSICAL EXAMNIATION: He has slight left shoulder elevation, with mild left tilt, mild retrocollis, mild right turn,  good range of motion, occasional no-no head titubation (most active muscle disease at the right upper cervical close to right mastoid region)  ASSESSMENT AND PLAN  Keith Faulkner is a 53 y.o. male   Cervical dystonia:   We used Xeomin  100 units (100 units is dissolved into 2 cc of normal saline )  under EMG guidance  Left levator scapular 12.5 units Left semispinalis 12.5 units  Right oblique capitis inferior 12.5 units Right splenius capitis 25 Right splenius cervix 25 units   Right longissimus capitis 12.5 units       Phebe Brasil, M.D. Ph.D.  [facility] [address] [place], [place] [address] Ph: [phone] Fax: [phone]  CC: Referring Provider

## 2024-03-26 NOTE — Progress Notes (Signed)
 xeomin  100units x 1 vial  [id number] Exp-2027/09 B/B Bacteriostatic 0.9% Sodium Chloride - 2mL  [id number] Expiration: 04/06/25 NDC: [id number] Dx: Terrel Ferries  WITNESSED BY:A Rochelle Chu RN

## 2024-03-27 DIAGNOSIS — G243 Spasmodic torticollis: Secondary | ICD-10-CM | POA: Diagnosis not present

## 2024-06-04 NOTE — Telephone Encounter (Signed)
 [employer] states we have to submit a new auth, it's too late to change servicing provider. I called and spoke with Keith Faulkner, he was able to submit auth with [facility] as the servicing provider. Shara is pending with case #: [id number], OV notes faxed to [phone].

## 2024-06-04 NOTE — Telephone Encounter (Signed)
 I left a VM with the nurse PA line @ [employer] requesting they change the servicing provider to [facility], also left my direct # and asked for a call back. This is what rep Jenkins advised me to do, call reference # is [id number].

## 2024-06-16 MED ORDER — XEOMIN 100 UNITS IM SOLR: 100.0000 [IU] | INTRAMUSCULAR | 3 refills | Status: DC

## 2024-06-16 NOTE — Telephone Encounter (Signed)
 REFILLED

## 2024-06-16 NOTE — Addendum Note (Signed)
 Addended by: ONEITA HOIST E on: 06/16/2024 10:35 AM   Modules accepted: Orders

## 2024-06-16 NOTE — Telephone Encounter (Signed)
 Auth still pending, decision to be made by 06/19/24.

## 2024-06-22 NOTE — Telephone Encounter (Signed)
 Called [employer] and spoke with Ram, confirmed shara was approved. Call reference # is [id number]. Relayed PA info to [facility] for processing.   Auth#: [id number] (06/05/24-06/04/25)

## 2024-06-25 ENCOUNTER — Telehealth: Payer: Self-pay | Admitting: Neurology

## 2024-06-25 MED ORDER — XEOMIN 100 UNITS IM SOLR: 100.0000 [IU] | INTRAMUSCULAR | 3 refills | Status: AC

## 2024-06-25 NOTE — Telephone Encounter (Signed)
 [email or web address]  is asking that a Rx be sent to them for pt's  incobotulinumtoxinA  (XEOMIN ) 100 units injection 100 Units  Fax#[phone]

## 2024-06-25 NOTE — Telephone Encounter (Signed)
 refilled

## 2024-06-30 NOTE — Telephone Encounter (Signed)
 [facility] is refusing to fill and saying they are OON even with the approval on file. I called [employer] and spoke with Brunetta, she states she will switch the servicing provider on auth back to our office for this visit and I will have to submit a new auth for next time to switch him to a different specialty pharmacy.

## 2024-07-01 ENCOUNTER — Ambulatory Visit: Admitting: Neurology

## 2024-07-01 ENCOUNTER — Encounter: Payer: Self-pay | Admitting: Neurology

## 2024-07-01 VITALS — BP 124/70 | Ht 71.0 in | Wt 199.0 lb

## 2024-07-01 DIAGNOSIS — G243 Spasmodic torticollis: Secondary | ICD-10-CM | POA: Diagnosis not present

## 2024-07-01 MED ORDER — INCOBOTULINUMTOXINA 100 UNITS IM SOLR
100.0000 [IU] | INTRAMUSCULAR | Status: AC
  Administered 2024-07-01: 100 [IU] via INTRAMUSCULAR

## 2024-07-01 MED ORDER — INCOBOTULINUMTOXINA 100 UNITS IM SOLR: 100.0000 [IU] | INTRAMUSCULAR | Status: DC

## 2024-07-01 NOTE — Progress Notes (Signed)
 xeomin  100units x 1 vial  Ndc-0259-1610-01 [id number] Exp-2027/10 B/B  Bacteriostatic 0.9% Sodium Chloride - 2mL  [id number]:[id number] Expiration: 08/07/25 NDC: [id number] Dx: G24.3  WITNESSED BY:a jones rn

## 2024-07-01 NOTE — Progress Notes (Signed)
 PATIENT: Keith Faulkner DOB: 18-Jul-1971  Chief Complaint  Patient presents with   Injections    Rm 14, alone, xeomin , consent signed     HISTORICAL  Keith Faulkner is a 53 year old male, seen in request by Dr. Colon Shove for evaluation of abnormal neck posturing  He was previously healthy, without clear triggers, he began to notice abnormal neck posturing since summer 2019, he has difficulty finding a comfortable position, later noticed constant neck pulling towards the left shoulder, leaning backwards, slow progressive, denies significant radiating pain to bilateral shoulder, or upper extremity, he denies gait abnormality  He was seen by chiropractor, with only minimal improvement, was later referred to have MRI of cervical spine [facility] in January 2020, C1-2, mild counterclockwise rotation of C1 relative to mild clockwise rotation of C2, with corresponding mild posterior positioning of the right lateral mass of C1 relative to right lateral mass of C2, mild multilevel degenerative changes, there was no significant canal or foraminal narrowing, no evidence of cord compression.  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, potential side effect explained, He was noted to have mild left shoulder elevation, mild retrocollis, moderate right turn, mild left tilt, significant atrophy of right upper cervical paraspinal muscles, tenderness,  UPDATE March 22 2020: Reported 80% improvement with his first EMG guided Xeomin  injection in May 2020, he lost follow-up due to concern of pandemic, he noticed increased abnormal neck posturing, forceful turning towards the right side, also head titubation, want to resume Xeomin  injection, there was no significant side effect from previous injectionI called the patient and scheduled his Xeomin  injection for 04/21/20.  UPDATE April 24 2020: He did not continue 3 months scheduled Xeomin  injection for his cervical dystonia in 2020, despite significant improvement in May 2020, he has a lot of cervical dystonia related questions, we have discussed the treatment option, decided to continue injection now, we are also seeking second opinion, I have referred him to [facility] neuromuscular clinic The most bothersome symptoms from his cervical dystonia are frequent head titubation, which often noticed by his colleagues, he also complains of posterior neck muscle strain, stiffness,  UPDATE Jul 27 2020: I reviewed [facility] evaluation by Dr. Jesslyn Blowers on June 16, 2020, confirmed diagnosis of cervical dystonia, suggest continue Botox injection, not a candidate for deep brain stimulation yet, may try clonazepam or Artane as needed, physical therapy,  Reported 80% improvement to his previous injection, had much less head titubation following injection, no significant side effect noted  He has anxiety, complains of insomnia, he was given prescription of clonazepam 0.5 mg every night, which has helped him sleep,  Update November 02, 2020: We used Xeomin  200 units during previous injection, he responded well, but during the past 2 weeks, he noticed wearing off, increased head titubation, also tendency of turning his neck towards the right shoulder, he reported significant improvement with physical therapy, stretching exercise, no significant neck or shoulder muscle pain  His sleep has improved as well, no longer requires sleeping agent, has stopped taking trazodone   UPDATE February 01 2021: He responded very well to previous injection no significant side effect noted  Update May 03, 2021: He responded well to previous injection, the most bothersome symptoms are frequent head titubation, and feeling his neck turning towards the right side, there was no significant pain  Update August 09, 2021: He responded well to previous  injection, no significant neck pain, is bothered by his frequent no-no head shaking, neck turning towards the right side  UPDATE Nov 09 2021: He responded very well to previous injection, no significant side effect noted.  UPDate Feb 08, 2022, He responded to 100 units xeomin  injection well, denies significant pain, the most bothersome symptoms is intermittent head tremor, wants to continue injection  Update May 22, 2022: Previous injection did help him, no significant side effect, no significant neck pain  Update August 28, 2022: He responded well to previous injection, noted increased head shaking 2 weeks out from previous scheduled time,  Update November 28, 2022: He did well with his previous injection, no side effect noted  UPDATE Sept 18 2024: He did well with previous injection  UPDATE Sep 25 2023: He did well to previous injection  UPDATE December 25 2023: He tolerated the injection well, no significant side effect noted,  UPDATE March 26 2024: He responded well to previous injection  UPDATE Sept 24 2025: He did well with previous injection, denying neck pain, mild head tremor  PHYSICAL EXAM   Vitals:   07/01/24 1417  BP: 124/70  Weight: 199 lb (90.3 kg)  Height: 5' 11 (1.803 m)    PHYSICAL EXAMNIATION: He has slight left shoulder elevation, with mild left tilt, mild retrocollis, mild right turn,  good range of motion, occasional no-no head titubation (most active muscle disease at the right upper cervical close to right mastoid region)  ASSESSMENT AND PLAN  Keith Faulkner is a 53 y.o. male   Cervical dystonia:   We used Xeomin  100 units (100 units is dissolved into 2 cc of normal saline )  under EMG guidance  Left levator scapular 25 units Left semispinalis 12.5 units   Right splenius capitis 25 Right splenius cervix 25 units   Right longissimus capitis 12.5 units       [name], M.D. Ph.D.  [facility] [address]  [address] [place], [place] [address] Ph: [phone] Fax: [phone]  CC: Referring Provider

## 2024-07-01 NOTE — Patient Instructions (Signed)
 SABRA

## 2024-07-08 NOTE — Telephone Encounter (Signed)
 FYIBETHA [facility]) calling in to PA that needed. We faxed the form that needs to be filled out to [phone]. If have any questions can call us  at [phone]

## 2024-08-31 NOTE — Telephone Encounter (Signed)
 Pt's appt is 10/05/24. Submitted PA request via CMM, status is pending. [id number]

## 2024-09-07 NOTE — Telephone Encounter (Signed)
 Called [employer] pharmacy benefit and spoke with Lara. She states I couldn't submit PA via CMM because PA is not needed. She ran and test claim and received a paid claim. I sent a fax to [facility] asking them to process claim again, reference # for call is Karilyn RAMAN. 09/07/24 @ 8:43 am EST.

## 2024-10-05 ENCOUNTER — Encounter: Payer: Self-pay | Admitting: Neurology

## 2024-10-05 ENCOUNTER — Ambulatory Visit: Admitting: Neurology

## 2024-10-05 VITALS — BP 111/78 | HR 73

## 2024-10-05 DIAGNOSIS — G243 Spasmodic torticollis: Secondary | ICD-10-CM | POA: Diagnosis not present

## 2024-10-05 MED ORDER — INCOBOTULINUMTOXINA 100 UNITS IM SOLR
100.0000 [IU] | INTRAMUSCULAR | Status: AC
  Administered 2024-10-05: 100 [IU] via INTRAMUSCULAR

## 2024-10-05 NOTE — Progress Notes (Signed)
 XEOMIN  100 units x 1 vial Lot: [id number] Expiration: 09/2026 NDC: 9740-8389-98  Bacteriostatic 0.9% Sodium Chloride - 2mL total Lot: [id number] Expiration: 01/05/2026 NDC:0409-1966-02  Dx: G24.3 B/B  Witnessed By: Sherrod Fontan, RMA

## 2024-10-05 NOTE — Progress Notes (Signed)
 "    PATIENT: Keith Faulkner DOB: Apr 27, 1971  Chief Complaint  Patient presents with   RM14/XEOMIN     Pt is here Alone. Cervical Dystonia    HISTORICAL  Keith Faulkner is a 53 year old male, seen in request by Dr. Colon Shove for evaluation of abnormal neck posturing  He was previously healthy, without clear triggers, he began to notice abnormal neck posturing since summer 2019, he has difficulty finding a comfortable position, later noticed constant neck pulling towards the left shoulder, leaning backwards, slow progressive, denies significant radiating pain to bilateral shoulder, or upper extremity, he denies gait abnormality  He was seen by chiropractor, with only minimal improvement, was later referred to have MRI of cervical spine [facility] in January 2020, C1-2, mild counterclockwise rotation of C1 relative to mild clockwise rotation of C2, with corresponding mild posterior positioning of the right lateral mass of C1 relative to right lateral mass of C2, mild multilevel degenerative changes, there was no significant canal or foraminal narrowing, no evidence of cord compression.  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, potential side effect explained, He was noted to have mild left shoulder elevation, mild retrocollis, moderate right turn, mild left tilt, significant atrophy of right upper cervical paraspinal muscles, tenderness,  UPDATE March 22 2020: Reported 80% improvement with his first EMG guided Xeomin  injection in May 2020, he lost follow-up due to concern of pandemic, he noticed increased abnormal neck posturing, forceful turning towards the right side, also head titubation, want to resume Xeomin  injection, there was no significant side effect from previous injectionI called the patient and scheduled his Xeomin  injection for 04/21/20.  UPDATE April 24 2020: He did not continue 3 months scheduled Xeomin  injection for his cervical dystonia in 2020, despite significant improvement in May 2020, he has a lot of cervical dystonia related questions, we have discussed the treatment option, decided to continue injection now, we are also seeking second opinion, I have referred him to [facility] neuromuscular clinic The most bothersome symptoms from his cervical dystonia are frequent head titubation, which often noticed by his colleagues, he also complains of posterior neck muscle strain, stiffness,  UPDATE Jul 27 2020: I reviewed [facility] evaluation by Dr. Jesslyn Blowers on June 16, 2020, confirmed diagnosis of cervical dystonia, suggest continue Botox injection, not a candidate for deep brain stimulation yet, may try clonazepam or Artane as needed, physical therapy,  Reported 80% improvement to his previous injection, had much less head titubation following injection, no significant side effect noted  He has anxiety, complains of insomnia, he was given prescription of clonazepam 0.5 mg every night, which has helped him sleep,  Update November 02, 2020: We used Xeomin  200 units during previous injection, he responded well, but during the past 2 weeks, he noticed wearing off, increased head titubation, also tendency of turning his neck towards the right shoulder, he reported significant improvement with physical therapy, stretching exercise, no significant neck or shoulder muscle pain  His sleep has improved as well, no longer requires sleeping agent, has stopped taking trazodone   UPDATE February 01 2021: He responded very well to previous injection no significant side effect noted  Update May 03, 2021: He responded well to previous injection, the most bothersome symptoms are frequent head titubation, and feeling his neck turning towards the right side, there was no significant pain  Update August 09, 2021: He responded well to previous  injection, no significant neck pain, is bothered by his frequent no-no head shaking, neck turning towards the right side  UPDATE Nov 09 2021: He responded very well to previous injection, no significant side effect noted.  UPDate Feb 08, 2022, He responded to 100 units xeomin  injection well, denies significant pain, the most bothersome symptoms is intermittent head tremor, wants to continue injection  Update May 22, 2022: Previous injection did help him, no significant side effect, no significant neck pain  Update August 28, 2022: He responded well to previous injection, noted increased head shaking 2 weeks out from previous scheduled time,  Update November 28, 2022: He did well with his previous injection, no side effect noted  UPDATE Sept 18 2024: He did well with previous injection  UPDATE Sep 25 2023: He did well to previous injection  UPDATE December 25 2023: He tolerated the injection well, no significant side effect noted,  UPDATE March 26 2024: He responded well to previous injection  UPDATE Sept 24 2025: He did well with previous injection, denying neck pain, mild head tremor  Update October 05, 2024: Every 3 months injection was helpful, denies significant side effect, no significant neck pain, do have recurrent intubation from previous injection wearing off   PHYSICAL EXAM   Vitals:   10/05/24 1508  BP: 111/78  Pulse: 73    PHYSICAL EXAMNIATION: He has slight left shoulder elevation, with mild left tilt, mild retrocollis, mild right turn,  good range of motion, occasional no-no head titubation (most active muscle disease at the right upper cervical close to right mastoid region)  ASSESSMENT AND PLAN  Keith Faulkner is a 53 y.o. male   Cervical dystonia:   We used Xeomin  100 units (100 units is dissolved into 2 cc of normal saline )  under EMG guidance  Left levator scapular 25 units Left semispinalis 12.5 units   Right splenius capitis 25 Right  splenius cervix 25 units   Right longissimus capitis 12.5 units   [name], M.D. Ph.D.  [facility] [address], [address] [place], [place] [address] Ph: [phone] Fax: [phone]  CC: Referring Provider "

## 2025-01-06 ENCOUNTER — Ambulatory Visit: Admitting: Neurology
